# Patient Record
Sex: Male | Born: 1955
Health system: Southern US, Community
[De-identification: ages and names within clinical notes are randomized; demographics above are authoritative.]

## PROBLEM LIST (undated history)

## (undated) DIAGNOSIS — E079 Disorder of thyroid, unspecified: Secondary | ICD-10-CM

## (undated) DIAGNOSIS — M1990 Unspecified osteoarthritis, unspecified site: Secondary | ICD-10-CM

## (undated) DIAGNOSIS — K219 Gastro-esophageal reflux disease without esophagitis: Secondary | ICD-10-CM

## (undated) DIAGNOSIS — E039 Hypothyroidism, unspecified: Secondary | ICD-10-CM

## (undated) DIAGNOSIS — D509 Iron deficiency anemia, unspecified: Secondary | ICD-10-CM

## (undated) HISTORY — DX: Disorder of thyroid, unspecified: E07.9

## (undated) HISTORY — PX: TONSILLECTOMY: SUR1361

## (undated) HISTORY — PX: VASECTOMY: SHX75

---

## 2006-02-24 ENCOUNTER — Ambulatory Visit: Payer: Self-pay | Admitting: Internal Medicine

## 2006-09-06 ENCOUNTER — Ambulatory Visit: Payer: Self-pay | Admitting: Internal Medicine

## 2006-10-12 ENCOUNTER — Ambulatory Visit: Payer: Self-pay | Admitting: Gastroenterology

## 2009-06-11 IMAGING — CR DG SHOULDER 3+V*L*
1 series · 4 of 4 positions shown · non-contrast
Comparison: none

REASON FOR EXAM: lt shoulder pain
COMMENTS:

PROCEDURE:     DXR - DXR SHOULDER LEFT COMPLETE  - September 06, 2006 [DATE]
RESULT:     No fracture, dislocation or other significant osseous
abnormality is identified.

[Series 1: view not recorded · 0.17mm/px · 4 of 4 slices shown]
[im 1/4]
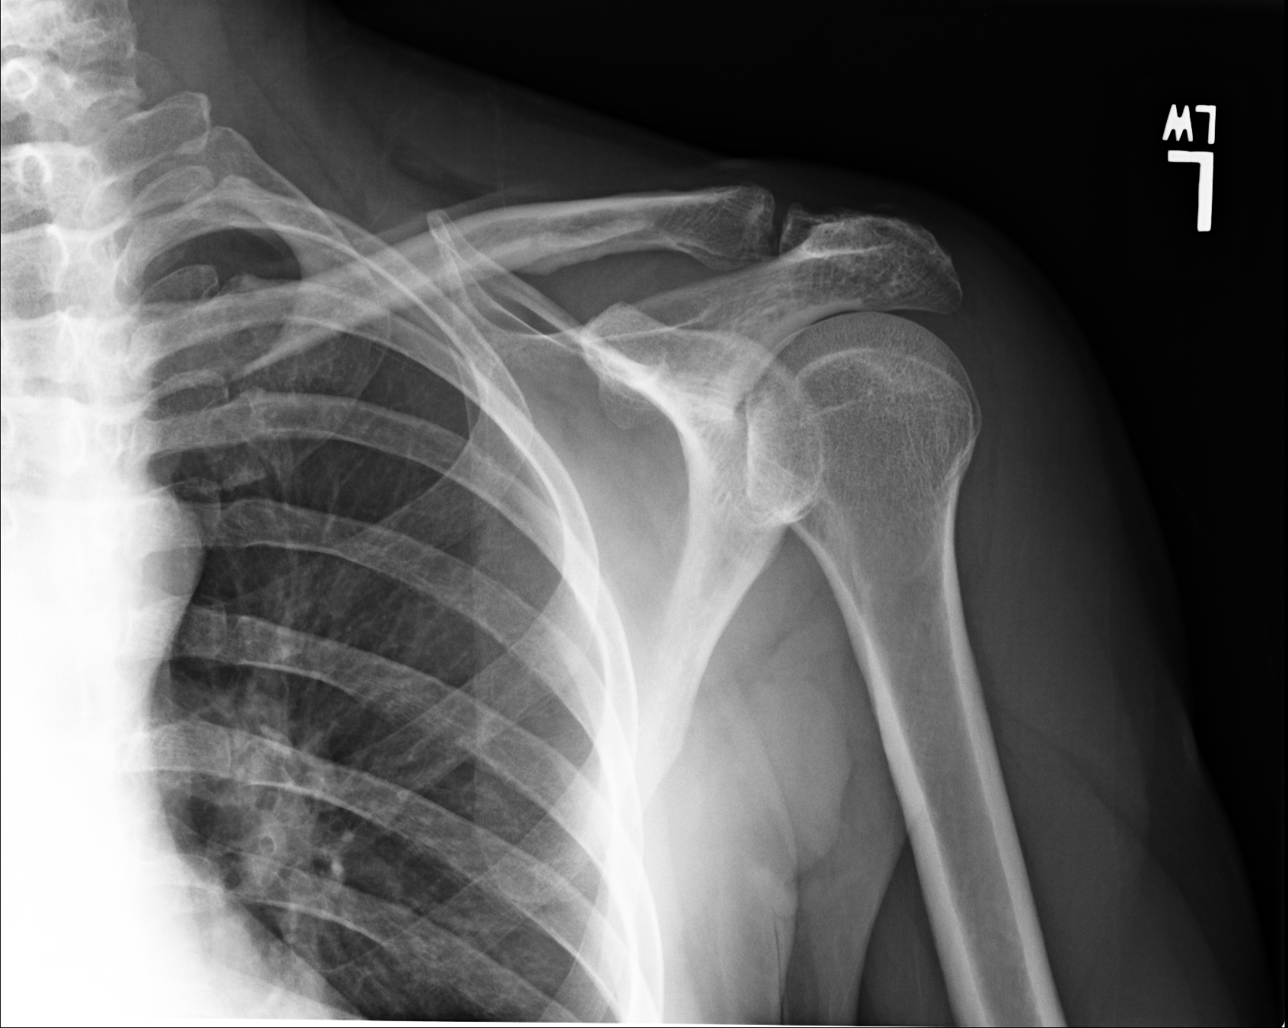
[im 2/4]
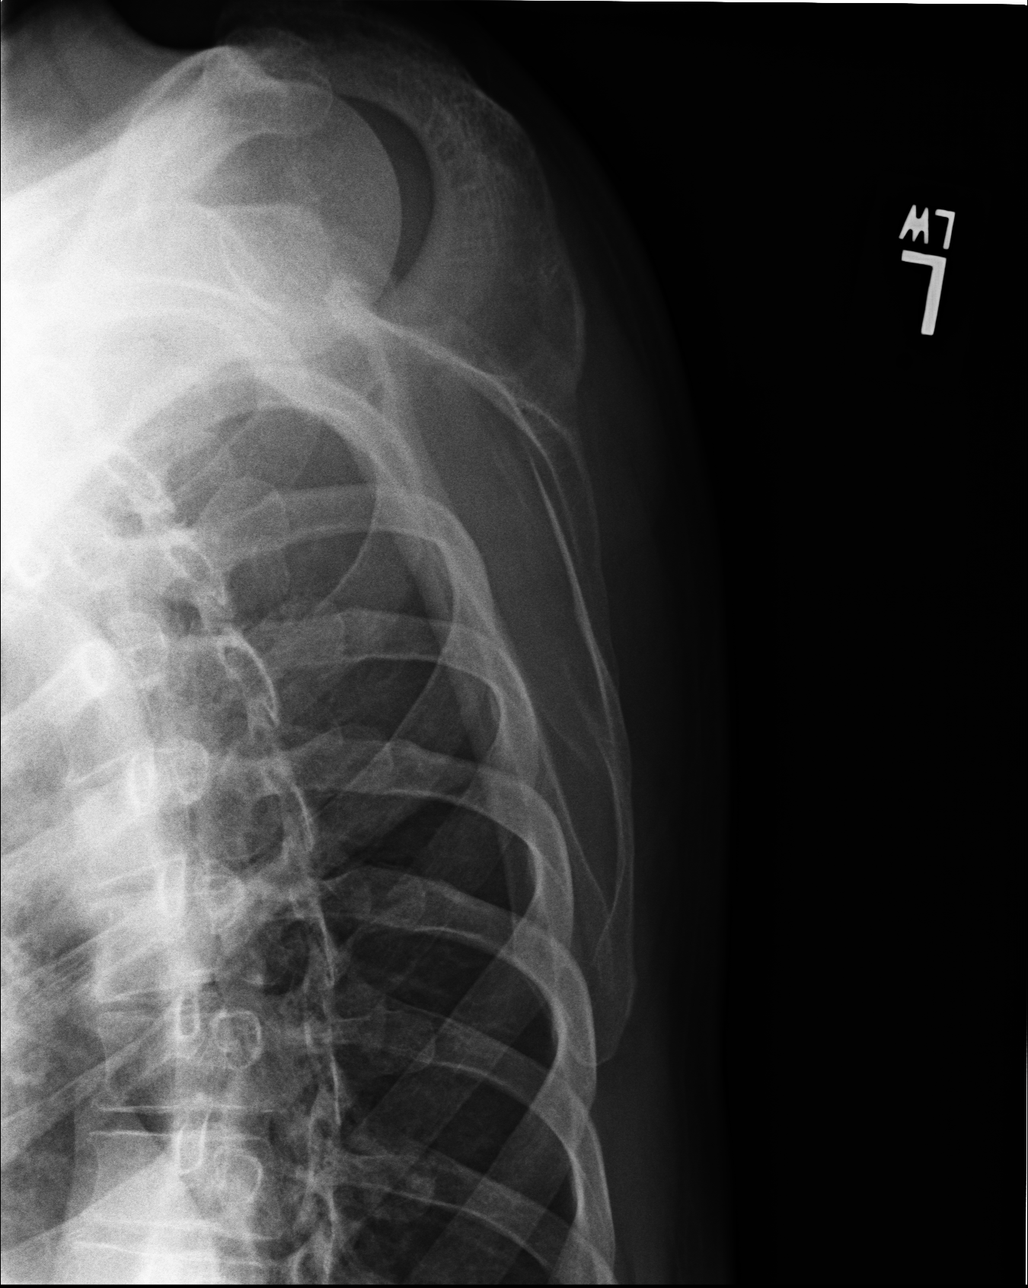
[im 3/4]
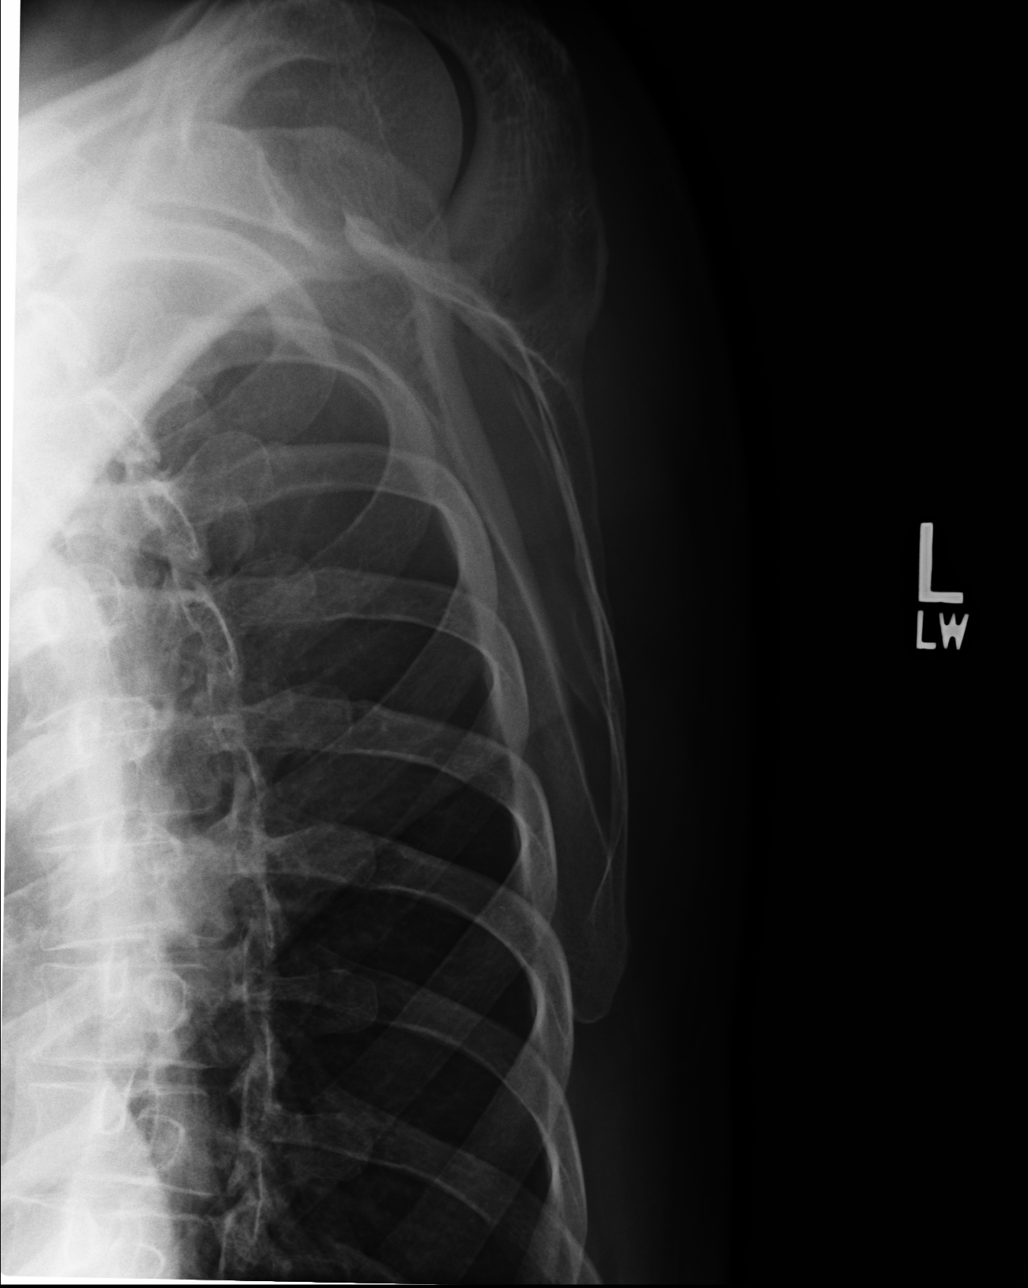
[im 4/4]
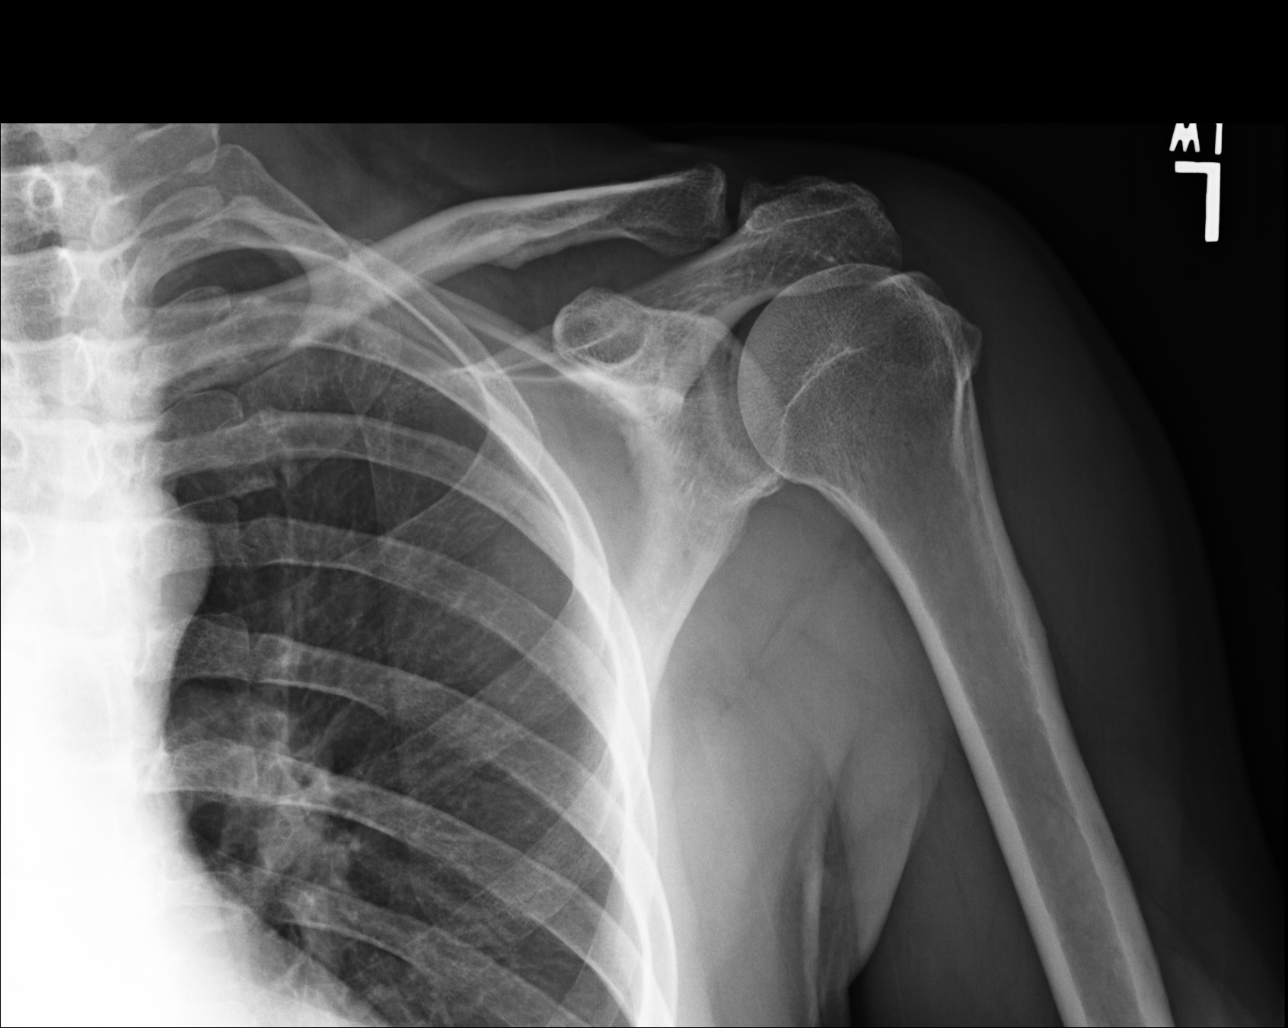

[4 of 4 positions shown; findings below may reference images not displayed]

IMPRESSION: 1.     No significant abnormalities are noted.

## 2011-01-07 ENCOUNTER — Other Ambulatory Visit: Payer: Self-pay | Admitting: Internal Medicine

## 2011-03-16 ENCOUNTER — Other Ambulatory Visit: Payer: Self-pay | Admitting: Internal Medicine

## 2011-05-20 ENCOUNTER — Other Ambulatory Visit: Payer: Self-pay | Admitting: Internal Medicine

## 2011-05-20 LAB — BASIC METABOLIC PANEL
Anion Gap: 9 (ref 7–16)
Creatinine: 0.87 mg/dL (ref 0.60–1.30)
EGFR (Non-African Amer.): >60
Glucose: 99 mg/dL (ref 65–99)
Osmolality: 283 (ref 275–301)
Potassium: 3.8 mmol/L (ref 3.5–5.1)
Sodium: 142 mmol/L (ref 136–145)

## 2011-05-20 LAB — LIPID PANEL
Ldl Cholesterol, Calc: 102 mg/dL — ABNORMAL HIGH (ref 0–100)
VLDL Cholesterol, Calc: 18 mg/dL (ref 5–40)

## 2011-05-20 LAB — HEMOGLOBIN A1C: Hemoglobin A1C: 5.6 % (ref 4.2–6.3)

## 2011-11-16 ENCOUNTER — Other Ambulatory Visit: Payer: Self-pay

## 2012-05-26 ENCOUNTER — Other Ambulatory Visit: Payer: Self-pay | Admitting: Internal Medicine

## 2012-05-26 LAB — URINALYSIS, COMPLETE
Bacteria: NONE SEEN
Bilirubin,UR: NEGATIVE
Blood: NEGATIVE
Glucose,UR: NEGATIVE mg/dL (ref 0–75)
Ketone: NEGATIVE
Nitrite: NEGATIVE
Ph: 6 (ref 4.5–8.0)
Protein: NEGATIVE
RBC,UR: <1 /HPF (ref 0–5)
Specific Gravity: 1.01 (ref 1.003–1.030)
Squamous Epithelial: NONE SEEN
WBC UR: <1 /HPF (ref 0–5)

## 2012-05-26 LAB — CBC WITH DIFFERENTIAL/PLATELET
Eosinophil %: 4.2 %
HCT: 38 % — ABNORMAL LOW (ref 40.0–52.0)
HGB: 12.7 g/dL — ABNORMAL LOW (ref 13.0–18.0)
Lymphocyte #: 2.4 10*3/uL (ref 1.0–3.6)
MCH: 28.1 pg (ref 26.0–34.0)
MCHC: 33.5 g/dL (ref 32.0–36.0)
MCV: 84 fL (ref 80–100)
Monocyte #: 0.7 x10 3/mm (ref 0.2–1.0)
Neutrophil %: 44.5 %
Platelet: 337 10*3/uL (ref 150–440)
RBC: 4.52 10*6/uL (ref 4.40–5.90)

## 2012-05-26 LAB — LIPID PANEL
Triglycerides: 118 mg/dL (ref 0–200)
VLDL Cholesterol, Calc: 24 mg/dL (ref 5–40)

## 2012-05-26 LAB — HEMOGLOBIN A1C: Hemoglobin A1C: 5.5 % (ref 4.2–6.3)

## 2012-05-26 LAB — BASIC METABOLIC PANEL
Anion Gap: 4 — ABNORMAL LOW (ref 7–16)
BUN: 10 mg/dL (ref 7–18)
Calcium, Total: 8.5 mg/dL (ref 8.5–10.1)
EGFR (African American): >60
EGFR (Non-African Amer.): >60
Osmolality: 274 (ref 275–301)
Potassium: 4.1 mmol/L (ref 3.5–5.1)
Sodium: 138 mmol/L (ref 136–145)

## 2012-05-26 LAB — HEPATIC FUNCTION PANEL A (ARMC)
Alkaline Phosphatase: 81 U/L (ref 50–136)
Bilirubin, Direct: <0.1 mg/dL (ref 0.00–0.20)
Total Protein: 7.3 g/dL (ref 6.4–8.2)

## 2012-05-26 LAB — TSH: Thyroid Stimulating Horm: 2.62 u[IU]/mL

## 2012-08-11 ENCOUNTER — Ambulatory Visit: Payer: Self-pay | Admitting: General Practice

## 2012-11-21 ENCOUNTER — Other Ambulatory Visit: Payer: Self-pay | Admitting: Internal Medicine

## 2012-11-21 LAB — URINALYSIS, COMPLETE
Bilirubin,UR: NEGATIVE
Ketone: NEGATIVE
RBC,UR: NONE SEEN /HPF (ref 0–5)
Squamous Epithelial: <1
WBC UR: 1 /HPF (ref 0–5)

## 2012-11-21 LAB — CBC WITH DIFFERENTIAL/PLATELET
Basophil %: 2.1 %
Lymphocyte %: 34.6 %
MCHC: 34.4 g/dL (ref 32.0–36.0)
Monocyte #: 0.5 x10 3/mm (ref 0.2–1.0)
Neutrophil #: 3.1 10*3/uL (ref 1.4–6.5)
Platelet: 373 10*3/uL (ref 150–440)
RBC: 4.55 10*6/uL (ref 4.40–5.90)
WBC: 6 10*3/uL (ref 3.8–10.6)

## 2012-11-21 LAB — BASIC METABOLIC PANEL
Anion Gap: 4 — ABNORMAL LOW (ref 7–16)
BUN: 8 mg/dL (ref 7–18)
Co2: 29 mmol/L (ref 21–32)
EGFR (African American): >60
Glucose: 96 mg/dL (ref 65–99)
Osmolality: 274 (ref 275–301)
Potassium: 3.7 mmol/L (ref 3.5–5.1)
Sodium: 138 mmol/L (ref 136–145)

## 2012-11-21 LAB — TSH: Thyroid Stimulating Horm: 3.4 u[IU]/mL

## 2012-11-21 LAB — IRON AND TIBC
Iron Bind.Cap.(Total): 487 ug/dL — ABNORMAL HIGH (ref 250–450)
Iron Saturation: 5 %
Iron: 24 ug/dL — ABNORMAL LOW (ref 65–175)
Unbound Iron-Bind.Cap.: 463 ug/dL

## 2012-11-21 LAB — RETICULOCYTES: Reticulocyte: 1.16 % (ref 0.4–3.1)

## 2012-11-21 LAB — LIPID PANEL: VLDL Cholesterol, Calc: 21 mg/dL (ref 5–40)

## 2012-11-30 ENCOUNTER — Other Ambulatory Visit: Payer: Self-pay | Admitting: Internal Medicine

## 2012-12-01 LAB — PSA: PSA: 1.1 ng/mL (ref 0.0–4.0)

## 2013-01-13 ENCOUNTER — Other Ambulatory Visit: Payer: Self-pay | Admitting: Family

## 2013-01-13 LAB — CBC WITH DIFFERENTIAL/PLATELET
Eosinophil #: 0.2 10*3/uL (ref 0.0–0.7)
HGB: 11.9 g/dL — ABNORMAL LOW (ref 13.0–18.0)
Lymphocyte %: 35.4 %
MCHC: 33.4 g/dL (ref 32.0–36.0)
MCV: 81 fL (ref 80–100)
Neutrophil #: 3.1 10*3/uL (ref 1.4–6.5)
Neutrophil %: 51 %
WBC: 6.1 10*3/uL (ref 3.8–10.6)

## 2013-01-13 LAB — IRON AND TIBC
Iron Bind.Cap.(Total): 473 ug/dL — ABNORMAL HIGH (ref 250–450)
Iron: 49 ug/dL — ABNORMAL LOW (ref 65–175)
Unbound Iron-Bind.Cap.: 424 ug/dL

## 2013-02-17 ENCOUNTER — Ambulatory Visit: Payer: Self-pay | Admitting: Gastroenterology

## 2013-03-03 ENCOUNTER — Other Ambulatory Visit: Payer: Self-pay | Admitting: Family

## 2013-03-03 LAB — CBC WITH DIFFERENTIAL/PLATELET
BASOS PCT: 1 %
Basophil #: 0.1 10*3/uL (ref 0.0–0.1)
Eosinophil #: 0.2 10*3/uL (ref 0.0–0.7)
Eosinophil %: 3.1 %
HCT: 41.7 % (ref 40.0–52.0)
HGB: 14 g/dL (ref 13.0–18.0)
LYMPHS PCT: 32.5 %
Lymphocyte #: 1.9 10*3/uL (ref 1.0–3.6)
MCH: 28.2 pg (ref 26.0–34.0)
MCHC: 33.4 g/dL (ref 32.0–36.0)
MCV: 84 fL (ref 80–100)
MONOS PCT: 9.6 %
Monocyte #: 0.6 x10 3/mm (ref 0.2–1.0)
NEUTROS ABS: 3.1 10*3/uL (ref 1.4–6.5)
NEUTROS PCT: 53.8 %
Platelet: 329 10*3/uL (ref 150–440)
RBC: 4.94 10*6/uL (ref 4.40–5.90)
RDW: 14.9 % — AB (ref 11.5–14.5)
WBC: 5.8 10*3/uL (ref 3.8–10.6)

## 2013-03-03 LAB — IRON: Iron: 69 ug/dL (ref 65–175)

## 2013-05-30 ENCOUNTER — Other Ambulatory Visit: Payer: Self-pay | Admitting: Internal Medicine

## 2013-05-30 LAB — BASIC METABOLIC PANEL
ANION GAP: 5 — AB (ref 7–16)
BUN: 9 mg/dL (ref 7–18)
CO2: 31 mmol/L (ref 21–32)
Calcium, Total: 8.7 mg/dL (ref 8.5–10.1)
Chloride: 104 mmol/L (ref 98–107)
Creatinine: 0.94 mg/dL (ref 0.60–1.30)
Glucose: 91 mg/dL (ref 65–99)
Osmolality: 278 (ref 275–301)
Potassium: 3.9 mmol/L (ref 3.5–5.1)
Sodium: 140 mmol/L (ref 136–145)

## 2013-05-30 LAB — CBC WITH DIFFERENTIAL/PLATELET
BASOS ABS: 0.1 10*3/uL (ref 0.0–0.1)
BASOS PCT: 1.3 %
Eosinophil #: 0.3 10*3/uL (ref 0.0–0.7)
Eosinophil %: 5.1 %
HCT: 41.9 % (ref 40.0–52.0)
HGB: 14.1 g/dL (ref 13.0–18.0)
LYMPHS ABS: 2.3 10*3/uL (ref 1.0–3.6)
LYMPHS PCT: 38.3 %
MCH: 28.6 pg (ref 26.0–34.0)
MCHC: 33.5 g/dL (ref 32.0–36.0)
MCV: 85 fL (ref 80–100)
MONO ABS: 0.6 x10 3/mm (ref 0.2–1.0)
MONOS PCT: 10.3 %
NEUTROS ABS: 2.7 10*3/uL (ref 1.4–6.5)
Neutrophil %: 45 %
Platelet: 276 10*3/uL (ref 150–440)
RBC: 4.93 10*6/uL (ref 4.40–5.90)
RDW: 13.4 % (ref 11.5–14.5)
WBC: 6.1 10*3/uL (ref 3.8–10.6)

## 2013-05-30 LAB — URINALYSIS, COMPLETE
BACTERIA: NONE SEEN
BILIRUBIN, UR: NEGATIVE
Blood: NEGATIVE
Glucose,UR: NEGATIVE mg/dL (ref 0–75)
Ketone: NEGATIVE
Leukocyte Esterase: NEGATIVE
NITRITE: NEGATIVE
PH: 6 (ref 4.5–8.0)
PROTEIN: NEGATIVE
SPECIFIC GRAVITY: 1.009 (ref 1.003–1.030)
SQUAMOUS EPITHELIAL: NONE SEEN
WBC UR: <1 /HPF (ref 0–5)

## 2013-05-30 LAB — HEPATIC FUNCTION PANEL A (ARMC)
ALT: 23 U/L (ref 12–78)
AST: 16 U/L (ref 15–37)
Albumin: 3.7 g/dL (ref 3.4–5.0)
Alkaline Phosphatase: 81 U/L
BILIRUBIN DIRECT: 0.1 mg/dL (ref 0.00–0.20)
BILIRUBIN TOTAL: 0.6 mg/dL (ref 0.2–1.0)
Total Protein: 7.6 g/dL (ref 6.4–8.2)

## 2013-05-30 LAB — LIPID PANEL
CHOLESTEROL: 204 mg/dL — AB (ref 0–200)
HDL: 39 mg/dL — AB (ref 40–60)
Ldl Cholesterol, Calc: 133 mg/dL — ABNORMAL HIGH (ref 0–100)
TRIGLYCERIDES: 162 mg/dL (ref 0–200)
VLDL Cholesterol, Calc: 32 mg/dL (ref 5–40)

## 2013-05-30 LAB — TSH: Thyroid Stimulating Horm: 2.8 u[IU]/mL

## 2013-12-01 ENCOUNTER — Ambulatory Visit: Payer: Self-pay | Admitting: Internal Medicine

## 2013-12-01 LAB — BASIC METABOLIC PANEL
Anion Gap: 7 (ref 7–16)
BUN: 15 mg/dL (ref 7–18)
CALCIUM: 8.4 mg/dL — AB (ref 8.5–10.1)
CHLORIDE: 107 mmol/L (ref 98–107)
CO2: 28 mmol/L (ref 21–32)
Creatinine: 0.97 mg/dL (ref 0.60–1.30)
EGFR (African American): >60
EGFR (Non-African Amer.): >60
Glucose: 89 mg/dL (ref 65–99)
Osmolality: 283 (ref 275–301)
POTASSIUM: 4 mmol/L (ref 3.5–5.1)
Sodium: 142 mmol/L (ref 136–145)

## 2013-12-01 LAB — URINALYSIS, COMPLETE
BACTERIA: NONE SEEN
BILIRUBIN, UR: NEGATIVE
Blood: NEGATIVE
GLUCOSE, UR: NEGATIVE mg/dL (ref 0–75)
Ketone: NEGATIVE
LEUKOCYTE ESTERASE: NEGATIVE
Nitrite: NEGATIVE
PH: 6 (ref 4.5–8.0)
PROTEIN: NEGATIVE
RBC,UR: <1 /HPF (ref 0–5)
Specific Gravity: 1.02 (ref 1.003–1.030)
Squamous Epithelial: <1
WBC UR: <1 /HPF (ref 0–5)

## 2013-12-01 LAB — CBC WITH DIFFERENTIAL/PLATELET
Basophil #: 0.2 10*3/uL — ABNORMAL HIGH (ref 0.0–0.1)
Basophil %: 2.2 %
EOS ABS: 0.3 10*3/uL (ref 0.0–0.7)
Eosinophil %: 4.3 %
HCT: 42.1 % (ref 40.0–52.0)
HGB: 14 g/dL (ref 13.0–18.0)
LYMPHS ABS: 2.7 10*3/uL (ref 1.0–3.6)
Lymphocyte %: 40.9 %
MCH: 29.2 pg (ref 26.0–34.0)
MCHC: 33.1 g/dL (ref 32.0–36.0)
MCV: 88 fL (ref 80–100)
MONOS PCT: 10.3 %
Monocyte #: 0.7 x10 3/mm (ref 0.2–1.0)
NEUTROS ABS: 2.8 10*3/uL (ref 1.4–6.5)
Neutrophil %: 42.3 %
Platelet: 325 10*3/uL (ref 150–440)
RBC: 4.78 10*6/uL (ref 4.40–5.90)
RDW: 13.2 % (ref 11.5–14.5)
WBC: 6.7 10*3/uL (ref 3.8–10.6)

## 2013-12-01 LAB — LIPID PANEL
Cholesterol: 202 mg/dL — ABNORMAL HIGH (ref 0–200)
HDL: 40 mg/dL (ref 40–60)
Ldl Cholesterol, Calc: 136 mg/dL — ABNORMAL HIGH (ref 0–100)
Triglycerides: 130 mg/dL (ref 0–200)
VLDL Cholesterol, Calc: 26 mg/dL (ref 5–40)

## 2013-12-01 LAB — IRON: IRON: 124 ug/dL (ref 65–175)

## 2013-12-01 LAB — FERRITIN: Ferritin (ARMC): 30 ng/mL (ref 8–388)

## 2013-12-04 LAB — PSA: PSA: 1 ng/mL (ref 0.0–4.0)

## 2014-06-08 ENCOUNTER — Other Ambulatory Visit
Admission: RE | Admit: 2014-06-08 | Discharge: 2014-06-08 | Disposition: A | Discharge status: Home / Self Care | Payer: 59 | Source: Ambulatory Visit | Attending: Internal Medicine | Admitting: Internal Medicine

## 2014-06-08 DIAGNOSIS — E039 Hypothyroidism, unspecified: Secondary | ICD-10-CM | POA: Insufficient documentation

## 2014-06-08 LAB — COMPREHENSIVE METABOLIC PANEL
ALT: 16 U/L — ABNORMAL LOW (ref 17–63)
AST: 19 U/L (ref 15–41)
Albumin: 3.9 g/dL (ref 3.5–5.0)
Alkaline Phosphatase: 65 U/L (ref 38–126)
Anion gap: 4 — ABNORMAL LOW (ref 5–15)
BUN: 14 mg/dL (ref 6–20)
CO2: 29 mmol/L (ref 22–32)
CREATININE: 0.92 mg/dL (ref 0.61–1.24)
Calcium: 8.7 mg/dL — ABNORMAL LOW (ref 8.9–10.3)
Chloride: 106 mmol/L (ref 101–111)
GFR calc Af Amer: >60 mL/min (ref >60)
Glucose, Bld: 105 mg/dL — ABNORMAL HIGH (ref 65–99)
POTASSIUM: 3.9 mmol/L (ref 3.5–5.1)
Sodium: 139 mmol/L (ref 135–145)
TOTAL PROTEIN: 7.5 g/dL (ref 6.5–8.1)
Total Bilirubin: 0.5 mg/dL (ref 0.3–1.2)

## 2014-06-08 LAB — URINALYSIS COMPLETE WITH MICROSCOPIC (ARMC ONLY)
Bacteria, UA: NONE SEEN
Bilirubin Urine: NEGATIVE
Glucose, UA: NEGATIVE mg/dL
Hgb urine dipstick: NEGATIVE
KETONES UR: NEGATIVE mg/dL
LEUKOCYTES UA: NEGATIVE
Nitrite: NEGATIVE
PH: 6 (ref 5.0–8.0)
Protein, ur: NEGATIVE mg/dL
RBC / HPF: NONE SEEN RBC/hpf (ref 0–5)
SQUAMOUS EPITHELIAL / LPF: NONE SEEN
Specific Gravity, Urine: 1.015 (ref 1.005–1.030)

## 2014-06-08 LAB — CBC WITH DIFFERENTIAL/PLATELET
BASOS ABS: 0.1 10*3/uL (ref 0–0.1)
Basophils Relative: 2 %
EOS ABS: 0.4 10*3/uL (ref 0–0.7)
Eosinophils Relative: 6 %
HCT: 42.8 % (ref 40.0–52.0)
Hemoglobin: 14.3 g/dL (ref 13.0–18.0)
LYMPHS ABS: 2.3 10*3/uL (ref 1.0–3.6)
Lymphocytes Relative: 41 %
MCH: 28.8 pg (ref 26.0–34.0)
MCHC: 33.5 g/dL (ref 32.0–36.0)
MCV: 85.9 fL (ref 80.0–100.0)
Monocytes Absolute: 0.5 10*3/uL (ref 0.2–1.0)
Monocytes Relative: 10 %
NEUTROS PCT: 41 %
Neutro Abs: 2.3 10*3/uL (ref 1.4–6.5)
PLATELETS: 291 10*3/uL (ref 150–440)
RBC: 4.98 MIL/uL (ref 4.40–5.90)
RDW: 12.8 % (ref 11.5–14.5)
WBC: 5.7 10*3/uL (ref 3.8–10.6)

## 2014-06-08 LAB — PSA: PSA: 0.93 ng/mL (ref 0.00–4.00)

## 2014-06-09 LAB — MISC LABCORP TEST (SEND OUT): LABCORP TEST CODE: 140659

## 2014-12-07 ENCOUNTER — Other Ambulatory Visit
Admission: RE | Admit: 2014-12-07 | Discharge: 2014-12-07 | Disposition: A | Discharge status: Home / Self Care | Payer: 59 | Source: Ambulatory Visit | Attending: Internal Medicine | Admitting: Internal Medicine

## 2014-12-07 DIAGNOSIS — E784 Other hyperlipidemia: Secondary | ICD-10-CM | POA: Insufficient documentation

## 2014-12-07 DIAGNOSIS — E034 Atrophy of thyroid (acquired): Secondary | ICD-10-CM | POA: Insufficient documentation

## 2014-12-07 DIAGNOSIS — E038 Other specified hypothyroidism: Secondary | ICD-10-CM | POA: Diagnosis present

## 2014-12-07 LAB — CBC WITH DIFFERENTIAL/PLATELET
BASOS PCT: 2 %
Basophils Absolute: 0.1 10*3/uL (ref 0–0.1)
Eosinophils Absolute: 0.3 10*3/uL (ref 0–0.7)
Eosinophils Relative: 4 %
HCT: 42.2 % (ref 40.0–52.0)
HEMOGLOBIN: 14.2 g/dL (ref 13.0–18.0)
LYMPHS ABS: 1.9 10*3/uL (ref 1.0–3.6)
Lymphocytes Relative: 31 %
MCH: 28.8 pg (ref 26.0–34.0)
MCHC: 33.7 g/dL (ref 32.0–36.0)
MCV: 85.7 fL (ref 80.0–100.0)
MONOS PCT: 11 %
Monocytes Absolute: 0.7 10*3/uL (ref 0.2–1.0)
NEUTROS PCT: 52 %
Neutro Abs: 3.1 10*3/uL (ref 1.4–6.5)
Platelets: 304 10*3/uL (ref 150–440)
RBC: 4.93 MIL/uL (ref 4.40–5.90)
RDW: 13.1 % (ref 11.5–14.5)
WBC: 6.1 10*3/uL (ref 3.8–10.6)

## 2014-12-07 LAB — LIPID PANEL
CHOLESTEROL: 209 mg/dL — AB (ref 0–200)
HDL: 42 mg/dL (ref >40)
LDL Cholesterol: 151 mg/dL — ABNORMAL HIGH (ref 0–99)
Total CHOL/HDL Ratio: 5 RATIO
Triglycerides: 81 mg/dL (ref <150)
VLDL: 16 mg/dL (ref 0–40)

## 2014-12-07 LAB — URINALYSIS COMPLETE WITH MICROSCOPIC (ARMC ONLY)
Bacteria, UA: NONE SEEN
Bilirubin Urine: NEGATIVE
Glucose, UA: NEGATIVE mg/dL
Ketones, ur: NEGATIVE mg/dL
Leukocytes, UA: NEGATIVE
Nitrite: NEGATIVE
PH: 5 (ref 5.0–8.0)
PROTEIN: NEGATIVE mg/dL
Specific Gravity, Urine: 1.015 (ref 1.005–1.030)

## 2014-12-07 LAB — COMPREHENSIVE METABOLIC PANEL
ALBUMIN: 4.3 g/dL (ref 3.5–5.0)
ALT: 19 U/L (ref 17–63)
AST: 22 U/L (ref 15–41)
Alkaline Phosphatase: 77 U/L (ref 38–126)
Anion gap: 7 (ref 5–15)
BILIRUBIN TOTAL: 0.3 mg/dL (ref 0.3–1.2)
BUN: 13 mg/dL (ref 6–20)
CHLORIDE: 105 mmol/L (ref 101–111)
CO2: 29 mmol/L (ref 22–32)
CREATININE: 0.98 mg/dL (ref 0.61–1.24)
Calcium: 9.4 mg/dL (ref 8.9–10.3)
GFR calc Af Amer: >60 mL/min (ref >60)
GFR calc non Af Amer: >60 mL/min (ref >60)
GLUCOSE: 103 mg/dL — AB (ref 65–99)
Potassium: 4.2 mmol/L (ref 3.5–5.1)
Sodium: 141 mmol/L (ref 135–145)
Total Protein: 7.6 g/dL (ref 6.5–8.1)

## 2014-12-07 LAB — TSH: TSH: 2.194 u[IU]/mL (ref 0.350–4.500)

## 2015-02-26 ENCOUNTER — Ambulatory Visit: Payer: Self-pay | Admitting: Physician Assistant

## 2015-02-26 ENCOUNTER — Encounter: Payer: Self-pay | Admitting: Physician Assistant

## 2015-02-26 VITALS — BP 110/70 | HR 88 | Temp 100.3°F

## 2015-02-26 DIAGNOSIS — R509 Fever, unspecified: Secondary | ICD-10-CM

## 2015-02-26 DIAGNOSIS — J069 Acute upper respiratory infection, unspecified: Secondary | ICD-10-CM

## 2015-02-26 LAB — POCT INFLUENZA A/B
Influenza A, POC: NEGATIVE
Influenza B, POC: NEGATIVE

## 2015-02-26 MED ORDER — HYDROCOD POLST-CPM POLST ER 10-8 MG/5ML PO SUER: 5.0000 mL | Freq: Two times a day (BID) | ORAL | Status: DC | PRN

## 2015-02-26 MED ORDER — LEVOFLOXACIN 500 MG PO TABS: 500.0000 mg | ORAL_TABLET | Freq: Every day | ORAL | Status: DC

## 2015-02-26 NOTE — Progress Notes (Signed)
S: c/o fever, chills, cough and congestion with yellow mucus, some body aches, no cp/sob, no v/d, sx started yesterday  O: vitals wnl, nad, tms dull , nasal mucosa wnl, throat injected, neck supple no lymph, lungs c t a, cv rrr, flu swab neg  A: acute uri  P: levaquin 500mg  qd x 10d, tussionex, otc meds for fever, no work until has not had fever in 24 hours

## 2015-03-28 DIAGNOSIS — M955 Acquired deformity of pelvis: Secondary | ICD-10-CM | POA: Diagnosis not present

## 2015-03-28 DIAGNOSIS — M9905 Segmental and somatic dysfunction of pelvic region: Secondary | ICD-10-CM | POA: Diagnosis not present

## 2015-03-28 DIAGNOSIS — M5416 Radiculopathy, lumbar region: Secondary | ICD-10-CM | POA: Diagnosis not present

## 2015-03-28 DIAGNOSIS — M9903 Segmental and somatic dysfunction of lumbar region: Secondary | ICD-10-CM | POA: Diagnosis not present

## 2015-06-07 ENCOUNTER — Other Ambulatory Visit
Admission: RE | Admit: 2015-06-07 | Discharge: 2015-06-07 | Disposition: A | Discharge status: Home / Self Care | Payer: 59 | Source: Ambulatory Visit | Attending: Internal Medicine | Admitting: Internal Medicine

## 2015-06-07 DIAGNOSIS — R972 Elevated prostate specific antigen [PSA]: Secondary | ICD-10-CM | POA: Diagnosis not present

## 2015-06-07 DIAGNOSIS — R739 Hyperglycemia, unspecified: Secondary | ICD-10-CM | POA: Diagnosis not present

## 2015-06-07 DIAGNOSIS — D509 Iron deficiency anemia, unspecified: Secondary | ICD-10-CM | POA: Diagnosis not present

## 2015-06-07 DIAGNOSIS — E034 Atrophy of thyroid (acquired): Secondary | ICD-10-CM | POA: Insufficient documentation

## 2015-06-07 DIAGNOSIS — Z125 Encounter for screening for malignant neoplasm of prostate: Secondary | ICD-10-CM | POA: Diagnosis not present

## 2015-06-07 LAB — CBC WITH DIFFERENTIAL/PLATELET
BASOS ABS: 0.1 10*3/uL (ref 0–0.1)
Basophils Relative: 2 %
EOS PCT: 4 %
Eosinophils Absolute: 0.2 10*3/uL (ref 0–0.7)
HCT: 41.1 % (ref 40.0–52.0)
Hemoglobin: 14 g/dL (ref 13.0–18.0)
LYMPHS PCT: 40 %
Lymphs Abs: 2.1 10*3/uL (ref 1.0–3.6)
MCH: 28.8 pg (ref 26.0–34.0)
MCHC: 34 g/dL (ref 32.0–36.0)
MCV: 84.5 fL (ref 80.0–100.0)
Monocytes Absolute: 0.5 10*3/uL (ref 0.2–1.0)
Monocytes Relative: 10 %
Neutro Abs: 2.3 10*3/uL (ref 1.4–6.5)
Neutrophils Relative %: 44 %
PLATELETS: 328 10*3/uL (ref 150–440)
RBC: 4.87 MIL/uL (ref 4.40–5.90)
RDW: 13.2 % (ref 11.5–14.5)
WBC: 5.3 10*3/uL (ref 3.8–10.6)

## 2015-06-07 LAB — COMPREHENSIVE METABOLIC PANEL
ALT: 17 U/L (ref 17–63)
AST: 19 U/L (ref 15–41)
Albumin: 4.2 g/dL (ref 3.5–5.0)
Alkaline Phosphatase: 63 U/L (ref 38–126)
Anion gap: 8 (ref 5–15)
BUN: 11 mg/dL (ref 6–20)
CHLORIDE: 108 mmol/L (ref 101–111)
CO2: 26 mmol/L (ref 22–32)
Calcium: 9 mg/dL (ref 8.9–10.3)
Creatinine, Ser: 1.04 mg/dL (ref 0.61–1.24)
Glucose, Bld: 101 mg/dL — ABNORMAL HIGH (ref 65–99)
POTASSIUM: 3.6 mmol/L (ref 3.5–5.1)
Sodium: 142 mmol/L (ref 135–145)
Total Bilirubin: 0.6 mg/dL (ref 0.3–1.2)
Total Protein: 7.2 g/dL (ref 6.5–8.1)

## 2015-06-07 LAB — URINALYSIS COMPLETE WITH MICROSCOPIC (ARMC ONLY)
BILIRUBIN URINE: NEGATIVE
Bacteria, UA: NONE SEEN
GLUCOSE, UA: NEGATIVE mg/dL
HGB URINE DIPSTICK: NEGATIVE
KETONES UR: NEGATIVE mg/dL
LEUKOCYTES UA: NEGATIVE
NITRITE: NEGATIVE
Protein, ur: NEGATIVE mg/dL
SPECIFIC GRAVITY, URINE: 1.015 (ref 1.005–1.030)
Squamous Epithelial / LPF: NONE SEEN
WBC, UA: NONE SEEN WBC/hpf (ref 0–5)
pH: 6 (ref 5.0–8.0)

## 2015-06-07 LAB — HEMOGLOBIN A1C: HEMOGLOBIN A1C: 5.5 % (ref 4.0–6.0)

## 2015-06-07 LAB — LIPID PANEL
CHOL/HDL RATIO: 5.3 ratio
CHOLESTEROL: 216 mg/dL — AB (ref 0–200)
HDL: 41 mg/dL (ref >40)
LDL Cholesterol: 155 mg/dL — ABNORMAL HIGH (ref 0–99)
Triglycerides: 101 mg/dL (ref <150)
VLDL: 20 mg/dL (ref 0–40)

## 2015-06-07 LAB — PSA: PSA: 0.94 ng/mL (ref 0.00–4.00)

## 2015-06-07 LAB — TSH: TSH: 2.691 u[IU]/mL (ref 0.350–4.500)

## 2015-06-12 DIAGNOSIS — E034 Atrophy of thyroid (acquired): Secondary | ICD-10-CM | POA: Diagnosis not present

## 2015-06-12 DIAGNOSIS — K219 Gastro-esophageal reflux disease without esophagitis: Secondary | ICD-10-CM | POA: Diagnosis not present

## 2015-06-12 DIAGNOSIS — E784 Other hyperlipidemia: Secondary | ICD-10-CM | POA: Diagnosis not present

## 2015-06-12 DIAGNOSIS — Z0001 Encounter for general adult medical examination with abnormal findings: Secondary | ICD-10-CM | POA: Diagnosis not present

## 2015-07-08 ENCOUNTER — Ambulatory Visit: Payer: Self-pay | Admitting: Physician Assistant

## 2015-07-08 ENCOUNTER — Encounter: Payer: Self-pay | Admitting: Physician Assistant

## 2015-07-08 VITALS — BP 118/80 | HR 64 | Temp 99.2°F

## 2015-07-08 DIAGNOSIS — R509 Fever, unspecified: Secondary | ICD-10-CM

## 2015-07-08 DIAGNOSIS — J069 Acute upper respiratory infection, unspecified: Secondary | ICD-10-CM

## 2015-07-08 MED ORDER — LEVOFLOXACIN 500 MG PO TABS: 500.0000 mg | ORAL_TABLET | Freq: Every day | ORAL | Status: DC

## 2015-07-08 NOTE — Progress Notes (Signed)
S: C/o runny nose, sore throat, cough and congestion for 3 days, + fever, chills, temp around 101 earlier; denies  cp/sob, v/d; mucus was green this am    Using otc meds: robitussin  O: PE: vitals wnl, nad, perrl eomi, normocephalic, tms dull, nasal mucosa red and swollen, throat red, neck supple no lymph, lungs c t a, cv rrr, neuro intact  A:  Acute uri   P: drink fluids, continue regular meds , use otc meds of choice, return if not improving in 5 days, return earlier if worsening, levaquin 500mg  qd x 10d

## 2015-11-04 ENCOUNTER — Encounter: Payer: Self-pay | Admitting: Physician Assistant

## 2015-11-04 ENCOUNTER — Ambulatory Visit: Payer: Self-pay | Admitting: Physician Assistant

## 2015-11-04 VITALS — BP 108/80 | HR 80 | Temp 98.1°F

## 2015-11-04 DIAGNOSIS — L259 Unspecified contact dermatitis, unspecified cause: Secondary | ICD-10-CM

## 2015-11-04 MED ORDER — DEXAMETHASONE SODIUM PHOSPHATE 10 MG/ML IJ SOLN
10.0000 mg | Freq: Once | INTRAMUSCULAR | Status: AC
  Administered 2015-11-04: 10 mg via INTRAMUSCULAR

## 2015-11-04 NOTE — Progress Notes (Signed)
S: c/o itchy rash on  arms for about a week; noticed after he used a towel at the gym, said the towel felt really stiff, did also go outside while in [place];  tried multiple otc meds without relief, denies fever/chills  O: vitals wnl, nad, lungs c t a, cv rrr, skin with small raised red areas , no drainage, n/v intact  A: acute contact dermatitis  P: decadron 10mg  im

## 2015-11-12 DIAGNOSIS — M9903 Segmental and somatic dysfunction of lumbar region: Secondary | ICD-10-CM | POA: Diagnosis not present

## 2015-11-12 DIAGNOSIS — M5416 Radiculopathy, lumbar region: Secondary | ICD-10-CM | POA: Diagnosis not present

## 2015-11-12 DIAGNOSIS — M955 Acquired deformity of pelvis: Secondary | ICD-10-CM | POA: Diagnosis not present

## 2015-11-12 DIAGNOSIS — M9905 Segmental and somatic dysfunction of pelvic region: Secondary | ICD-10-CM | POA: Diagnosis not present

## 2015-12-23 DIAGNOSIS — E784 Other hyperlipidemia: Secondary | ICD-10-CM | POA: Diagnosis not present

## 2015-12-23 DIAGNOSIS — D509 Iron deficiency anemia, unspecified: Secondary | ICD-10-CM | POA: Diagnosis not present

## 2015-12-23 DIAGNOSIS — K219 Gastro-esophageal reflux disease without esophagitis: Secondary | ICD-10-CM | POA: Diagnosis not present

## 2015-12-23 DIAGNOSIS — E034 Atrophy of thyroid (acquired): Secondary | ICD-10-CM | POA: Diagnosis not present

## 2015-12-24 ENCOUNTER — Other Ambulatory Visit
Admission: RE | Admit: 2015-12-24 | Discharge: 2015-12-24 | Disposition: A | Discharge status: Home / Self Care | Payer: 59 | Source: Ambulatory Visit | Attending: Internal Medicine | Admitting: Internal Medicine

## 2015-12-24 DIAGNOSIS — D509 Iron deficiency anemia, unspecified: Secondary | ICD-10-CM | POA: Insufficient documentation

## 2015-12-24 LAB — COMPREHENSIVE METABOLIC PANEL
ALK PHOS: 62 U/L (ref 38–126)
ALT: 16 U/L — AB (ref 17–63)
ANION GAP: 4 — AB (ref 5–15)
AST: 19 U/L (ref 15–41)
Albumin: 4.2 g/dL (ref 3.5–5.0)
BILIRUBIN TOTAL: 0.7 mg/dL (ref 0.3–1.2)
BUN: 13 mg/dL (ref 6–20)
CALCIUM: 9 mg/dL (ref 8.9–10.3)
CO2: 29 mmol/L (ref 22–32)
CREATININE: 0.88 mg/dL (ref 0.61–1.24)
Chloride: 105 mmol/L (ref 101–111)
Glucose, Bld: 101 mg/dL — ABNORMAL HIGH (ref 65–99)
Potassium: 4.3 mmol/L (ref 3.5–5.1)
Sodium: 138 mmol/L (ref 135–145)
TOTAL PROTEIN: 7.3 g/dL (ref 6.5–8.1)

## 2015-12-24 LAB — CBC WITH DIFFERENTIAL/PLATELET
Basophils Absolute: 0.1 10*3/uL (ref 0–0.1)
Basophils Relative: 2 %
Eosinophils Absolute: 0.2 10*3/uL (ref 0–0.7)
Eosinophils Relative: 4 %
HCT: 41.8 % (ref 40.0–52.0)
HEMOGLOBIN: 14.5 g/dL (ref 13.0–18.0)
LYMPHS ABS: 2.4 10*3/uL (ref 1.0–3.6)
LYMPHS PCT: 41 %
MCH: 29.9 pg (ref 26.0–34.0)
MCHC: 34.6 g/dL (ref 32.0–36.0)
MCV: 86.6 fL (ref 80.0–100.0)
Monocytes Absolute: 0.6 10*3/uL (ref 0.2–1.0)
Monocytes Relative: 10 %
NEUTROS PCT: 43 %
Neutro Abs: 2.6 10*3/uL (ref 1.4–6.5)
Platelets: 310 10*3/uL (ref 150–440)
RBC: 4.83 MIL/uL (ref 4.40–5.90)
RDW: 13 % (ref 11.5–14.5)
WBC: 5.8 10*3/uL (ref 3.8–10.6)

## 2015-12-24 LAB — LIPID PANEL
CHOLESTEROL: 219 mg/dL — AB (ref 0–200)
HDL: 45 mg/dL (ref >40)
LDL Cholesterol: 153 mg/dL — ABNORMAL HIGH (ref 0–99)
TRIGLYCERIDES: 105 mg/dL (ref <150)
Total CHOL/HDL Ratio: 4.9 RATIO
VLDL: 21 mg/dL (ref 0–40)

## 2015-12-24 LAB — TSH: TSH: 2.512 u[IU]/mL (ref 0.350–4.500)

## 2015-12-25 LAB — TESTOSTERONE: Testosterone: 505 ng/dL (ref 264–916)

## 2016-01-31 ENCOUNTER — Encounter: Payer: Self-pay | Admitting: Physician Assistant

## 2016-01-31 ENCOUNTER — Ambulatory Visit: Payer: Self-pay | Admitting: Physician Assistant

## 2016-01-31 VITALS — BP 132/76 | HR 76 | Temp 99.1°F

## 2016-01-31 DIAGNOSIS — J069 Acute upper respiratory infection, unspecified: Secondary | ICD-10-CM

## 2016-01-31 MED ORDER — PSEUDOEPH-BROMPHEN-DM 30-2-10 MG/5ML PO SYRP: 5.0000 mL | ORAL_SOLUTION | Freq: Four times a day (QID) | ORAL | 0 refills | Status: AC | PRN

## 2016-01-31 NOTE — Progress Notes (Signed)
   Subjective:cough    Patient ID: Charles Phillips., male    DOB: 05-22-1955, 60 y.o.   MRN: [id number]  HPI Patient c/o productive cough for 2 days. States sputum is clear. Also states sinus congestion and frontal headache. No palliative measure for compliant.   Review of Systems Negative except for compliant.    Objective:   Physical Exam Bilateral maxillary guarding, edematous nasal turbinates, with post nasal drainage.  Neck supple, Lungs CTA, and Heart RRR.       Assessment & Plan:URI  Bromfed DM as directed. Follow up with Family Doctor.

## 2016-02-12 ENCOUNTER — Ambulatory Visit: Payer: Self-pay | Admitting: Physician Assistant

## 2016-02-12 ENCOUNTER — Encounter: Payer: Self-pay | Admitting: Physician Assistant

## 2016-02-12 VITALS — BP 110/80 | HR 86 | Temp 98.3°F

## 2016-02-12 DIAGNOSIS — J209 Acute bronchitis, unspecified: Secondary | ICD-10-CM

## 2016-02-12 MED ORDER — LEVOFLOXACIN 500 MG PO TABS: 500.0000 mg | ORAL_TABLET | Freq: Every day | ORAL | 0 refills | Status: AC

## 2016-02-12 MED ORDER — HYDROCOD POLST-CPM POLST ER 10-8 MG/5ML PO SUER: 5.0000 mL | Freq: Two times a day (BID) | ORAL | 0 refills | Status: AC | PRN

## 2016-02-12 NOTE — Progress Notes (Signed)
S: C/o cough and congestion with yellow to green mucus, no fever, chills, states cough is dry and hacking; keeping pt awake at night;  denies cardiac type chest pain or sob, v/d, abd pain, was seen here on dec 29 and given bromfed, now he is worse; Remainder ros neg  O: vitals wnl, nad, tms clear, throat injected, neck supple no lymph, lungs with coarse bs b/l, cv rrr, neuro intact  A:  Acute bronchitis   P:  rx medication: levaquin 500mg  qd, 10d, tussionex 133ml nr; use otc meds, tylenol or motrin as needed for fever/chills, return if not better in 3 -5 days, return earlier if worsening

## 2016-02-28 ENCOUNTER — Other Ambulatory Visit: Payer: Self-pay | Admitting: Physician Assistant

## 2016-02-28 MED ORDER — BENZONATATE 200 MG PO CAPS: 200.0000 mg | ORAL_CAPSULE | Freq: Three times a day (TID) | ORAL | 0 refills | Status: AC | PRN

## 2016-02-28 NOTE — Progress Notes (Unsigned)
Pt called and said he needs something for a cough, was given tussionex on 1/10 and bromphed on 12/29; will call in tessalon perls, if cough continues then pt will need a cxr

## 2016-03-02 DIAGNOSIS — D225 Melanocytic nevi of trunk: Secondary | ICD-10-CM | POA: Diagnosis not present

## 2016-03-02 DIAGNOSIS — D2272 Melanocytic nevi of left lower limb, including hip: Secondary | ICD-10-CM | POA: Diagnosis not present

## 2016-03-02 DIAGNOSIS — L538 Other specified erythematous conditions: Secondary | ICD-10-CM | POA: Diagnosis not present

## 2016-03-02 DIAGNOSIS — L820 Inflamed seborrheic keratosis: Secondary | ICD-10-CM | POA: Diagnosis not present

## 2016-03-02 DIAGNOSIS — D2271 Melanocytic nevi of right lower limb, including hip: Secondary | ICD-10-CM | POA: Diagnosis not present

## 2016-03-02 DIAGNOSIS — D2261 Melanocytic nevi of right upper limb, including shoulder: Secondary | ICD-10-CM | POA: Diagnosis not present

## 2016-03-19 DIAGNOSIS — H52223 Regular astigmatism, bilateral: Secondary | ICD-10-CM | POA: Diagnosis not present

## 2016-03-19 DIAGNOSIS — H5203 Hypermetropia, bilateral: Secondary | ICD-10-CM | POA: Diagnosis not present

## 2016-03-19 DIAGNOSIS — H524 Presbyopia: Secondary | ICD-10-CM | POA: Diagnosis not present

## 2016-03-19 DIAGNOSIS — H2513 Age-related nuclear cataract, bilateral: Secondary | ICD-10-CM | POA: Diagnosis not present

## 2016-05-25 DIAGNOSIS — M9905 Segmental and somatic dysfunction of pelvic region: Secondary | ICD-10-CM | POA: Diagnosis not present

## 2016-05-25 DIAGNOSIS — M9903 Segmental and somatic dysfunction of lumbar region: Secondary | ICD-10-CM | POA: Diagnosis not present

## 2016-05-25 DIAGNOSIS — M955 Acquired deformity of pelvis: Secondary | ICD-10-CM | POA: Diagnosis not present

## 2016-05-25 DIAGNOSIS — M5416 Radiculopathy, lumbar region: Secondary | ICD-10-CM | POA: Diagnosis not present

## 2016-06-22 DIAGNOSIS — E784 Other hyperlipidemia: Secondary | ICD-10-CM | POA: Diagnosis not present

## 2016-06-22 DIAGNOSIS — K219 Gastro-esophageal reflux disease without esophagitis: Secondary | ICD-10-CM | POA: Diagnosis not present

## 2016-06-22 DIAGNOSIS — Z0000 Encounter for general adult medical examination without abnormal findings: Secondary | ICD-10-CM | POA: Diagnosis not present

## 2016-06-22 DIAGNOSIS — Z125 Encounter for screening for malignant neoplasm of prostate: Secondary | ICD-10-CM | POA: Diagnosis not present

## 2016-06-22 DIAGNOSIS — D509 Iron deficiency anemia, unspecified: Secondary | ICD-10-CM | POA: Diagnosis not present

## 2016-06-22 DIAGNOSIS — E034 Atrophy of thyroid (acquired): Secondary | ICD-10-CM | POA: Diagnosis not present

## 2016-10-30 DIAGNOSIS — M5416 Radiculopathy, lumbar region: Secondary | ICD-10-CM | POA: Diagnosis not present

## 2016-10-30 DIAGNOSIS — M9903 Segmental and somatic dysfunction of lumbar region: Secondary | ICD-10-CM | POA: Diagnosis not present

## 2016-10-30 DIAGNOSIS — M955 Acquired deformity of pelvis: Secondary | ICD-10-CM | POA: Diagnosis not present

## 2016-10-30 DIAGNOSIS — M9905 Segmental and somatic dysfunction of pelvic region: Secondary | ICD-10-CM | POA: Diagnosis not present

## 2016-12-16 DIAGNOSIS — E7849 Other hyperlipidemia: Secondary | ICD-10-CM | POA: Diagnosis not present

## 2016-12-23 DIAGNOSIS — E7849 Other hyperlipidemia: Secondary | ICD-10-CM | POA: Diagnosis not present

## 2016-12-23 DIAGNOSIS — E034 Atrophy of thyroid (acquired): Secondary | ICD-10-CM | POA: Diagnosis not present

## 2016-12-23 DIAGNOSIS — K219 Gastro-esophageal reflux disease without esophagitis: Secondary | ICD-10-CM | POA: Diagnosis not present

## 2016-12-23 DIAGNOSIS — D509 Iron deficiency anemia, unspecified: Secondary | ICD-10-CM | POA: Diagnosis not present

## 2016-12-29 DIAGNOSIS — M9903 Segmental and somatic dysfunction of lumbar region: Secondary | ICD-10-CM | POA: Diagnosis not present

## 2016-12-29 DIAGNOSIS — M62830 Muscle spasm of back: Secondary | ICD-10-CM | POA: Diagnosis not present

## 2016-12-29 DIAGNOSIS — M9901 Segmental and somatic dysfunction of cervical region: Secondary | ICD-10-CM | POA: Diagnosis not present

## 2016-12-29 DIAGNOSIS — M5416 Radiculopathy, lumbar region: Secondary | ICD-10-CM | POA: Diagnosis not present

## 2016-12-31 DIAGNOSIS — M9901 Segmental and somatic dysfunction of cervical region: Secondary | ICD-10-CM | POA: Diagnosis not present

## 2016-12-31 DIAGNOSIS — M9903 Segmental and somatic dysfunction of lumbar region: Secondary | ICD-10-CM | POA: Diagnosis not present

## 2016-12-31 DIAGNOSIS — M5416 Radiculopathy, lumbar region: Secondary | ICD-10-CM | POA: Diagnosis not present

## 2016-12-31 DIAGNOSIS — M62830 Muscle spasm of back: Secondary | ICD-10-CM | POA: Diagnosis not present

## 2017-01-19 DIAGNOSIS — M5416 Radiculopathy, lumbar region: Secondary | ICD-10-CM | POA: Diagnosis not present

## 2017-01-19 DIAGNOSIS — M9903 Segmental and somatic dysfunction of lumbar region: Secondary | ICD-10-CM | POA: Diagnosis not present

## 2017-01-19 DIAGNOSIS — M62830 Muscle spasm of back: Secondary | ICD-10-CM | POA: Diagnosis not present

## 2017-01-19 DIAGNOSIS — M9901 Segmental and somatic dysfunction of cervical region: Secondary | ICD-10-CM | POA: Diagnosis not present

## 2017-06-01 DIAGNOSIS — M62830 Muscle spasm of back: Secondary | ICD-10-CM | POA: Diagnosis not present

## 2017-06-01 DIAGNOSIS — M9901 Segmental and somatic dysfunction of cervical region: Secondary | ICD-10-CM | POA: Diagnosis not present

## 2017-06-01 DIAGNOSIS — M9903 Segmental and somatic dysfunction of lumbar region: Secondary | ICD-10-CM | POA: Diagnosis not present

## 2017-06-01 DIAGNOSIS — M5416 Radiculopathy, lumbar region: Secondary | ICD-10-CM | POA: Diagnosis not present

## 2017-06-08 DIAGNOSIS — H52223 Regular astigmatism, bilateral: Secondary | ICD-10-CM | POA: Diagnosis not present

## 2017-06-08 DIAGNOSIS — H5203 Hypermetropia, bilateral: Secondary | ICD-10-CM | POA: Diagnosis not present

## 2017-06-08 DIAGNOSIS — H524 Presbyopia: Secondary | ICD-10-CM | POA: Diagnosis not present

## 2017-06-16 DIAGNOSIS — E7849 Other hyperlipidemia: Secondary | ICD-10-CM | POA: Diagnosis not present

## 2017-06-16 DIAGNOSIS — Z125 Encounter for screening for malignant neoplasm of prostate: Secondary | ICD-10-CM | POA: Diagnosis not present

## 2017-06-16 DIAGNOSIS — D509 Iron deficiency anemia, unspecified: Secondary | ICD-10-CM | POA: Diagnosis not present

## 2017-06-23 DIAGNOSIS — Z0000 Encounter for general adult medical examination without abnormal findings: Secondary | ICD-10-CM | POA: Diagnosis not present

## 2017-06-23 DIAGNOSIS — E034 Atrophy of thyroid (acquired): Secondary | ICD-10-CM | POA: Diagnosis not present

## 2017-06-23 DIAGNOSIS — E7849 Other hyperlipidemia: Secondary | ICD-10-CM | POA: Diagnosis not present

## 2017-06-23 DIAGNOSIS — K219 Gastro-esophageal reflux disease without esophagitis: Secondary | ICD-10-CM | POA: Diagnosis not present

## 2017-07-05 DIAGNOSIS — M9903 Segmental and somatic dysfunction of lumbar region: Secondary | ICD-10-CM | POA: Diagnosis not present

## 2017-07-05 DIAGNOSIS — M5416 Radiculopathy, lumbar region: Secondary | ICD-10-CM | POA: Diagnosis not present

## 2017-07-05 DIAGNOSIS — M62830 Muscle spasm of back: Secondary | ICD-10-CM | POA: Diagnosis not present

## 2017-07-05 DIAGNOSIS — M9901 Segmental and somatic dysfunction of cervical region: Secondary | ICD-10-CM | POA: Diagnosis not present

## 2017-07-06 DIAGNOSIS — Z1211 Encounter for screening for malignant neoplasm of colon: Secondary | ICD-10-CM | POA: Diagnosis not present

## 2017-07-06 DIAGNOSIS — D509 Iron deficiency anemia, unspecified: Secondary | ICD-10-CM | POA: Diagnosis not present

## 2017-09-06 DIAGNOSIS — D509 Iron deficiency anemia, unspecified: Secondary | ICD-10-CM | POA: Diagnosis not present

## 2017-12-14 DIAGNOSIS — D509 Iron deficiency anemia, unspecified: Secondary | ICD-10-CM | POA: Diagnosis not present

## 2017-12-14 DIAGNOSIS — E7849 Other hyperlipidemia: Secondary | ICD-10-CM | POA: Diagnosis not present

## 2017-12-20 DIAGNOSIS — M9901 Segmental and somatic dysfunction of cervical region: Secondary | ICD-10-CM | POA: Diagnosis not present

## 2017-12-20 DIAGNOSIS — M9903 Segmental and somatic dysfunction of lumbar region: Secondary | ICD-10-CM | POA: Diagnosis not present

## 2017-12-20 DIAGNOSIS — M62830 Muscle spasm of back: Secondary | ICD-10-CM | POA: Diagnosis not present

## 2017-12-20 DIAGNOSIS — M5416 Radiculopathy, lumbar region: Secondary | ICD-10-CM | POA: Diagnosis not present

## 2017-12-21 DIAGNOSIS — D509 Iron deficiency anemia, unspecified: Secondary | ICD-10-CM | POA: Diagnosis not present

## 2017-12-21 DIAGNOSIS — K219 Gastro-esophageal reflux disease without esophagitis: Secondary | ICD-10-CM | POA: Diagnosis not present

## 2017-12-21 DIAGNOSIS — E034 Atrophy of thyroid (acquired): Secondary | ICD-10-CM | POA: Diagnosis not present

## 2017-12-21 DIAGNOSIS — E7849 Other hyperlipidemia: Secondary | ICD-10-CM | POA: Diagnosis not present

## 2017-12-23 DIAGNOSIS — K219 Gastro-esophageal reflux disease without esophagitis: Secondary | ICD-10-CM | POA: Diagnosis not present

## 2017-12-23 DIAGNOSIS — R079 Chest pain, unspecified: Secondary | ICD-10-CM | POA: Diagnosis not present

## 2018-03-15 ENCOUNTER — Encounter: Payer: Self-pay | Admitting: Urology

## 2018-03-15 ENCOUNTER — Ambulatory Visit (INDEPENDENT_AMBULATORY_CARE_PROVIDER_SITE_OTHER): Payer: 59 | Admitting: Urology

## 2018-03-15 VITALS — BP 126/80 | HR 82 | Ht 69.0 in | Wt 162.0 lb

## 2018-03-15 DIAGNOSIS — N529 Male erectile dysfunction, unspecified: Secondary | ICD-10-CM | POA: Diagnosis not present

## 2018-03-15 MED ORDER — SILDENAFIL CITRATE 20 MG PO TABS: 20.0000 mg | ORAL_TABLET | ORAL | 11 refills | Status: AC | PRN

## 2018-03-15 NOTE — Patient Instructions (Signed)
Take 1-5 tabs of 20mg  Revatio on an empty stomach for erectile dysfunction. You must take this medication at least one hour prior to sexual activity. OK to take aleve or tylenol for headache.     Erectile Dysfunction Erectile dysfunction (ED) is the inability to get or keep an erection in order to have sexual intercourse. Erectile dysfunction may include:  Inability to get an erection.  Lack of enough hardness of the erection to allow penetration.  Loss of the erection before sex is finished. What are the causes? This condition may be caused by:  Certain medicines, such as: ? Pain relievers. ? Antihistamines. ? Antidepressants. ? Blood pressure medicines. ? Water pills (diuretics). ? Ulcer medicines. ? Muscle relaxants. ? Drugs.  Excessive drinking.  Psychological causes, such as: ? Anxiety. ? Depression. ? Sadness. ? Exhaustion. ? Performance fear. ? Stress.  Physical causes, such as: ? Artery problems. This may include diabetes, smoking, liver disease, or atherosclerosis. ? High blood pressure. ? Hormonal problems, such as low testosterone. ? Obesity. ? Nerve problems. This may include back or pelvic injuries, diabetes mellitus, multiple sclerosis, or Parkinson disease. What are the signs or symptoms? Symptoms of this condition include:  Inability to get an erection.  Lack of enough hardness of the erection to allow penetration.  Loss of the erection before sex is finished.  Normal erections at some times, but with frequent unsatisfactory episodes.  Low sexual satisfaction in either partner due to erection problems.  A curved penis occurring with erection. The curve may cause pain or the penis may be too curved to allow for intercourse.  Never having nighttime erections. How is this diagnosed? This condition is often diagnosed by:  Performing a physical exam to find other diseases or specific problems with the penis.  Asking you detailed questions  about the problem.  Performing blood tests to check for diabetes mellitus or to measure hormone levels.  Performing other tests to check for underlying health conditions.  Performing an ultrasound exam to check for scarring.  Performing a test to check blood flow to the penis.  Doing a sleep study at home to measure nighttime erections. How is this treated? This condition may be treated by:  Medicine taken by mouth to help you achieve an erection (oral medicine).  Hormone replacement therapy to replace low testosterone levels.  Medicine that is injected into the penis. Your health care provider may instruct you how to give yourself these injections at home.  Vacuum pump. This is a pump with a ring on it. The pump and ring are placed on the penis and used to create pressure that helps the penis become erect.  Penile implant surgery. In this procedure, you may receive: ? An inflatable implant. This consists of cylinders, a pump, and a reservoir. The cylinders can be inflated with a fluid that helps to create an erection, and they can be deflated after intercourse. ? A semi-rigid implant. This consists of two silicone rubber rods. The rods provide some rigidity. They are also flexible, so the penis can both curve downward in its normal position and become straight for sexual intercourse.  Blood vessel surgery, to improve blood flow to the penis. During this procedure, a blood vessel from a different part of the body is placed into the penis to allow blood to flow around (bypass) damaged or blocked blood vessels.  Lifestyle changes, such as exercising more, losing weight, and quitting smoking. Follow these instructions at home: Medicines   Take  over-the-counter and prescription medicines only as told by your health care provider. Do not increase the dosage without first discussing it with your health care provider.  If you are using self-injections, perform injections as directed by  your health care provider. Make sure to avoid any veins that are on the surface of the penis. After giving an injection, apply pressure to the injection site for 5 minutes. General instructions  Exercise regularly, as directed by your health care provider. Work with your health care provider to lose weight, if needed.  Do not use any products that contain nicotine or tobacco, such as cigarettes and e-cigarettes. If you need help quitting, ask your health care provider.  Before using a vacuum pump, read the instructions that come with the pump and discuss any questions with your health care provider.  Keep all follow-up visits as told by your health care provider. This is important. Contact a health care provider if:  You feel nauseous.  You vomit. Get help right away if:  You are taking oral or injectable medicines and you have an erection that lasts longer than 4 hours. If your health care provider is unavailable, go to the nearest emergency room for evaluation. An erection that lasts much longer than 4 hours can result in permanent damage to your penis.  You have severe pain in your groin or abdomen.  You develop redness or severe swelling of your penis.  You have redness spreading up into your groin or lower abdomen.  You are unable to urinate.  You experience chest pain or a rapid heart beat (palpitations) after taking oral medicines. Summary  Erectile dysfunction (ED) is the inability to get or keep an erection during sexual intercourse. This problem can usually be treated successfully.  This condition is diagnosed based on a physical exam, your symptoms, and tests to determine the cause. Treatment varies depending on the cause, and may include medicines, hormone therapy, surgery, or vacuum pump.  You may need follow-up visits to make sure that you are using your medicines or devices correctly.  Get help right away if you are taking or injecting medicines and you have an  erection that lasts longer than 4 hours. This information is not intended to replace advice given to you by your health care provider. Make sure you discuss any questions you have with your health care provider. Document Released: 01/17/2000 Document Revised: 02/05/2016 Document Reviewed: 02/05/2016 Elsevier Interactive Patient Education  2019 [employer].

## 2018-03-15 NOTE — Progress Notes (Signed)
   03/15/2018 2:42 PM   [place]. May 19, 1955 [id number]  Referring provider: Adin Hector, MD Charles Phillips, [place] [address]  CC: Erectile dysfunction  HPI: I saw Charles Phillips in urology clinic today in consultation for erectile dysfunction from Dr. Caryl Comes.  He is a very healthy 63 year old male that reports difficulty maintaining an erection over the last year.  Severity is moderate.  He has tried 5 mg Cialis previously, however discontinued this medication secondary to headaches.  He denies any weight loss, flank pain, or gross hematuria.  There are no aggravating or alleviating factors.  He denies a family history of prostate cancer.  PSA was 0.84 in May 2019.  He is a non-smoker.   PMH: Past Medical History:  Diagnosis Date  . Thyroid disease     Allergies:  Allergies  Allergen Reactions  . Sulfa Antibiotics Other (See Comments)    Unspecified reaction    Family History: Family History  Problem Relation Age of Onset  . Bladder Cancer Neg Hx   . Kidney cancer Neg Hx   . Prostate cancer Neg Hx     Social History:  reports that he has never smoked. He does not have any smokeless tobacco history on file. No history on file for alcohol and drug.  ROS: Please see flowsheet from today's date for complete review of systems.  Physical Exam: BP 126/80 (BP Location: Left Arm, Patient Position: Sitting)   Pulse 82   Ht 5\' 9"  (1.753 m)   Wt 162 lb (73.5 kg)   BMI 23.92 kg/m    Constitutional:  Alert and oriented, No acute distress. Cardiovascular: No clubbing, cyanosis, or edema. Respiratory: Normal respiratory effort, no increased work of breathing. GI: Abdomen is soft, nontender, nondistended, no abdominal masses GU: No CVA tenderness Lymph: No cervical or inguinal lymphadenopathy. Skin: No rashes, bruises or suspicious lesions. Neurologic: Grossly intact, no focal deficits, moving all 4 extremities. Psychiatric:  Normal mood and affect.  Laboratory Data: None to review  Pertinent Imaging: None to review  Assessment & Plan:   In summary, the patient is a healthy 63 year old male with 1 year history of difficulty maintaining erection.  Cialis gave him significant headaches and he discontinued this medication.  We discussed at length the treatment algorithm for managing erectile dysfunction.  We discussed starting with the PDE 5 inhibitors, and adjusting dose to minimize side effects while still getting optimal results.  Other alternatives would include the vacuum erection device, penile injections, or penile implant.  -Trial of Revatio 20 to 100 mg on demand, we discussed possible side effects at length -Consider trial of Levitra if side effects from Revatio -Follow-up with Zara Council, PA for discussion of penile injections if refractory to all PDE 5 inhibitors   Billey Co, MD  [place] [address], [place], [place] [address] [phone]

## 2018-06-21 DIAGNOSIS — K219 Gastro-esophageal reflux disease without esophagitis: Secondary | ICD-10-CM | POA: Diagnosis not present

## 2018-06-21 DIAGNOSIS — Z125 Encounter for screening for malignant neoplasm of prostate: Secondary | ICD-10-CM | POA: Diagnosis not present

## 2018-06-21 DIAGNOSIS — E7849 Other hyperlipidemia: Secondary | ICD-10-CM | POA: Diagnosis not present

## 2018-06-21 DIAGNOSIS — D509 Iron deficiency anemia, unspecified: Secondary | ICD-10-CM | POA: Diagnosis not present

## 2018-06-28 DIAGNOSIS — Z0000 Encounter for general adult medical examination without abnormal findings: Secondary | ICD-10-CM | POA: Diagnosis not present

## 2018-06-28 DIAGNOSIS — K219 Gastro-esophageal reflux disease without esophagitis: Secondary | ICD-10-CM | POA: Diagnosis not present

## 2018-06-28 DIAGNOSIS — E034 Atrophy of thyroid (acquired): Secondary | ICD-10-CM | POA: Diagnosis not present

## 2018-06-28 DIAGNOSIS — E7849 Other hyperlipidemia: Secondary | ICD-10-CM | POA: Diagnosis not present

## 2018-09-20 DIAGNOSIS — M9903 Segmental and somatic dysfunction of lumbar region: Secondary | ICD-10-CM | POA: Diagnosis not present

## 2018-09-20 DIAGNOSIS — M62830 Muscle spasm of back: Secondary | ICD-10-CM | POA: Diagnosis not present

## 2018-09-20 DIAGNOSIS — M9901 Segmental and somatic dysfunction of cervical region: Secondary | ICD-10-CM | POA: Diagnosis not present

## 2018-09-20 DIAGNOSIS — M5416 Radiculopathy, lumbar region: Secondary | ICD-10-CM | POA: Diagnosis not present

## 2018-09-22 DIAGNOSIS — M5416 Radiculopathy, lumbar region: Secondary | ICD-10-CM | POA: Diagnosis not present

## 2018-09-22 DIAGNOSIS — M62830 Muscle spasm of back: Secondary | ICD-10-CM | POA: Diagnosis not present

## 2018-09-22 DIAGNOSIS — M9903 Segmental and somatic dysfunction of lumbar region: Secondary | ICD-10-CM | POA: Diagnosis not present

## 2018-09-22 DIAGNOSIS — M9901 Segmental and somatic dysfunction of cervical region: Secondary | ICD-10-CM | POA: Diagnosis not present

## 2018-09-27 DIAGNOSIS — M62830 Muscle spasm of back: Secondary | ICD-10-CM | POA: Diagnosis not present

## 2018-09-27 DIAGNOSIS — M9903 Segmental and somatic dysfunction of lumbar region: Secondary | ICD-10-CM | POA: Diagnosis not present

## 2018-09-27 DIAGNOSIS — M9901 Segmental and somatic dysfunction of cervical region: Secondary | ICD-10-CM | POA: Diagnosis not present

## 2018-09-27 DIAGNOSIS — M5416 Radiculopathy, lumbar region: Secondary | ICD-10-CM | POA: Diagnosis not present

## 2018-10-04 DIAGNOSIS — M9903 Segmental and somatic dysfunction of lumbar region: Secondary | ICD-10-CM | POA: Diagnosis not present

## 2018-10-04 DIAGNOSIS — M62830 Muscle spasm of back: Secondary | ICD-10-CM | POA: Diagnosis not present

## 2018-10-04 DIAGNOSIS — M5416 Radiculopathy, lumbar region: Secondary | ICD-10-CM | POA: Diagnosis not present

## 2018-10-04 DIAGNOSIS — M9901 Segmental and somatic dysfunction of cervical region: Secondary | ICD-10-CM | POA: Diagnosis not present

## 2018-10-11 DIAGNOSIS — M9901 Segmental and somatic dysfunction of cervical region: Secondary | ICD-10-CM | POA: Diagnosis not present

## 2018-10-11 DIAGNOSIS — M62830 Muscle spasm of back: Secondary | ICD-10-CM | POA: Diagnosis not present

## 2018-10-11 DIAGNOSIS — M5416 Radiculopathy, lumbar region: Secondary | ICD-10-CM | POA: Diagnosis not present

## 2018-10-11 DIAGNOSIS — M9903 Segmental and somatic dysfunction of lumbar region: Secondary | ICD-10-CM | POA: Diagnosis not present

## 2018-10-18 DIAGNOSIS — M9901 Segmental and somatic dysfunction of cervical region: Secondary | ICD-10-CM | POA: Diagnosis not present

## 2018-10-18 DIAGNOSIS — M62830 Muscle spasm of back: Secondary | ICD-10-CM | POA: Diagnosis not present

## 2018-10-18 DIAGNOSIS — M9903 Segmental and somatic dysfunction of lumbar region: Secondary | ICD-10-CM | POA: Diagnosis not present

## 2018-10-18 DIAGNOSIS — M5416 Radiculopathy, lumbar region: Secondary | ICD-10-CM | POA: Diagnosis not present

## 2018-11-01 DIAGNOSIS — M5416 Radiculopathy, lumbar region: Secondary | ICD-10-CM | POA: Diagnosis not present

## 2018-11-01 DIAGNOSIS — M9901 Segmental and somatic dysfunction of cervical region: Secondary | ICD-10-CM | POA: Diagnosis not present

## 2018-11-01 DIAGNOSIS — M62830 Muscle spasm of back: Secondary | ICD-10-CM | POA: Diagnosis not present

## 2018-11-01 DIAGNOSIS — M9903 Segmental and somatic dysfunction of lumbar region: Secondary | ICD-10-CM | POA: Diagnosis not present

## 2018-11-29 DIAGNOSIS — M5416 Radiculopathy, lumbar region: Secondary | ICD-10-CM | POA: Diagnosis not present

## 2018-11-29 DIAGNOSIS — M62830 Muscle spasm of back: Secondary | ICD-10-CM | POA: Diagnosis not present

## 2018-11-29 DIAGNOSIS — M9903 Segmental and somatic dysfunction of lumbar region: Secondary | ICD-10-CM | POA: Diagnosis not present

## 2018-11-29 DIAGNOSIS — M9901 Segmental and somatic dysfunction of cervical region: Secondary | ICD-10-CM | POA: Diagnosis not present

## 2018-12-21 DIAGNOSIS — H2513 Age-related nuclear cataract, bilateral: Secondary | ICD-10-CM | POA: Diagnosis not present

## 2018-12-21 DIAGNOSIS — Z135 Encounter for screening for eye and ear disorders: Secondary | ICD-10-CM | POA: Diagnosis not present

## 2018-12-21 DIAGNOSIS — H524 Presbyopia: Secondary | ICD-10-CM | POA: Diagnosis not present

## 2018-12-26 DIAGNOSIS — E7849 Other hyperlipidemia: Secondary | ICD-10-CM | POA: Diagnosis not present

## 2018-12-26 DIAGNOSIS — D509 Iron deficiency anemia, unspecified: Secondary | ICD-10-CM | POA: Diagnosis not present

## 2018-12-28 DIAGNOSIS — D509 Iron deficiency anemia, unspecified: Secondary | ICD-10-CM | POA: Diagnosis not present

## 2018-12-28 DIAGNOSIS — E7849 Other hyperlipidemia: Secondary | ICD-10-CM | POA: Diagnosis not present

## 2018-12-28 DIAGNOSIS — E034 Atrophy of thyroid (acquired): Secondary | ICD-10-CM | POA: Diagnosis not present

## 2018-12-28 DIAGNOSIS — K219 Gastro-esophageal reflux disease without esophagitis: Secondary | ICD-10-CM | POA: Diagnosis not present

## 2019-01-03 DIAGNOSIS — M9903 Segmental and somatic dysfunction of lumbar region: Secondary | ICD-10-CM | POA: Diagnosis not present

## 2019-01-03 DIAGNOSIS — M62830 Muscle spasm of back: Secondary | ICD-10-CM | POA: Diagnosis not present

## 2019-01-03 DIAGNOSIS — M5416 Radiculopathy, lumbar region: Secondary | ICD-10-CM | POA: Diagnosis not present

## 2019-01-03 DIAGNOSIS — M9901 Segmental and somatic dysfunction of cervical region: Secondary | ICD-10-CM | POA: Diagnosis not present

## 2019-01-31 DIAGNOSIS — M9903 Segmental and somatic dysfunction of lumbar region: Secondary | ICD-10-CM | POA: Diagnosis not present

## 2019-01-31 DIAGNOSIS — M62830 Muscle spasm of back: Secondary | ICD-10-CM | POA: Diagnosis not present

## 2019-01-31 DIAGNOSIS — M9901 Segmental and somatic dysfunction of cervical region: Secondary | ICD-10-CM | POA: Diagnosis not present

## 2019-01-31 DIAGNOSIS — M5416 Radiculopathy, lumbar region: Secondary | ICD-10-CM | POA: Diagnosis not present

## 2019-02-06 DIAGNOSIS — I071 Rheumatic tricuspid insufficiency: Secondary | ICD-10-CM | POA: Diagnosis not present

## 2019-02-28 DIAGNOSIS — M9903 Segmental and somatic dysfunction of lumbar region: Secondary | ICD-10-CM | POA: Diagnosis not present

## 2019-02-28 DIAGNOSIS — M62830 Muscle spasm of back: Secondary | ICD-10-CM | POA: Diagnosis not present

## 2019-02-28 DIAGNOSIS — M5416 Radiculopathy, lumbar region: Secondary | ICD-10-CM | POA: Diagnosis not present

## 2019-02-28 DIAGNOSIS — M9901 Segmental and somatic dysfunction of cervical region: Secondary | ICD-10-CM | POA: Diagnosis not present

## 2019-03-24 DIAGNOSIS — M9903 Segmental and somatic dysfunction of lumbar region: Secondary | ICD-10-CM | POA: Diagnosis not present

## 2019-03-24 DIAGNOSIS — M62830 Muscle spasm of back: Secondary | ICD-10-CM | POA: Diagnosis not present

## 2019-03-24 DIAGNOSIS — M9901 Segmental and somatic dysfunction of cervical region: Secondary | ICD-10-CM | POA: Diagnosis not present

## 2019-03-24 DIAGNOSIS — M5416 Radiculopathy, lumbar region: Secondary | ICD-10-CM | POA: Diagnosis not present

## 2019-03-25 DIAGNOSIS — M9901 Segmental and somatic dysfunction of cervical region: Secondary | ICD-10-CM | POA: Diagnosis not present

## 2019-03-25 DIAGNOSIS — M9903 Segmental and somatic dysfunction of lumbar region: Secondary | ICD-10-CM | POA: Diagnosis not present

## 2019-03-25 DIAGNOSIS — M5416 Radiculopathy, lumbar region: Secondary | ICD-10-CM | POA: Diagnosis not present

## 2019-03-25 DIAGNOSIS — M62830 Muscle spasm of back: Secondary | ICD-10-CM | POA: Diagnosis not present

## 2019-03-28 DIAGNOSIS — M62830 Muscle spasm of back: Secondary | ICD-10-CM | POA: Diagnosis not present

## 2019-03-28 DIAGNOSIS — M9903 Segmental and somatic dysfunction of lumbar region: Secondary | ICD-10-CM | POA: Diagnosis not present

## 2019-03-28 DIAGNOSIS — M5416 Radiculopathy, lumbar region: Secondary | ICD-10-CM | POA: Diagnosis not present

## 2019-03-28 DIAGNOSIS — M9901 Segmental and somatic dysfunction of cervical region: Secondary | ICD-10-CM | POA: Diagnosis not present

## 2019-03-30 DIAGNOSIS — M62830 Muscle spasm of back: Secondary | ICD-10-CM | POA: Diagnosis not present

## 2019-03-30 DIAGNOSIS — M9901 Segmental and somatic dysfunction of cervical region: Secondary | ICD-10-CM | POA: Diagnosis not present

## 2019-03-30 DIAGNOSIS — M9903 Segmental and somatic dysfunction of lumbar region: Secondary | ICD-10-CM | POA: Diagnosis not present

## 2019-03-30 DIAGNOSIS — M5416 Radiculopathy, lumbar region: Secondary | ICD-10-CM | POA: Diagnosis not present

## 2019-04-25 DIAGNOSIS — M9903 Segmental and somatic dysfunction of lumbar region: Secondary | ICD-10-CM | POA: Diagnosis not present

## 2019-04-25 DIAGNOSIS — M9901 Segmental and somatic dysfunction of cervical region: Secondary | ICD-10-CM | POA: Diagnosis not present

## 2019-04-25 DIAGNOSIS — M62830 Muscle spasm of back: Secondary | ICD-10-CM | POA: Diagnosis not present

## 2019-04-25 DIAGNOSIS — M5416 Radiculopathy, lumbar region: Secondary | ICD-10-CM | POA: Diagnosis not present

## 2019-05-08 DIAGNOSIS — M5416 Radiculopathy, lumbar region: Secondary | ICD-10-CM | POA: Diagnosis not present

## 2019-05-08 DIAGNOSIS — M9903 Segmental and somatic dysfunction of lumbar region: Secondary | ICD-10-CM | POA: Diagnosis not present

## 2019-05-08 DIAGNOSIS — M9901 Segmental and somatic dysfunction of cervical region: Secondary | ICD-10-CM | POA: Diagnosis not present

## 2019-05-08 DIAGNOSIS — M62830 Muscle spasm of back: Secondary | ICD-10-CM | POA: Diagnosis not present

## 2019-06-22 DIAGNOSIS — E7849 Other hyperlipidemia: Secondary | ICD-10-CM | POA: Diagnosis not present

## 2019-06-22 DIAGNOSIS — Z125 Encounter for screening for malignant neoplasm of prostate: Secondary | ICD-10-CM | POA: Diagnosis not present

## 2019-06-22 DIAGNOSIS — K219 Gastro-esophageal reflux disease without esophagitis: Secondary | ICD-10-CM | POA: Diagnosis not present

## 2019-06-22 DIAGNOSIS — D509 Iron deficiency anemia, unspecified: Secondary | ICD-10-CM | POA: Diagnosis not present

## 2019-06-29 ENCOUNTER — Other Ambulatory Visit: Payer: Self-pay | Admitting: Internal Medicine

## 2019-06-29 DIAGNOSIS — Z0000 Encounter for general adult medical examination without abnormal findings: Secondary | ICD-10-CM | POA: Diagnosis not present

## 2019-06-29 DIAGNOSIS — Z136 Encounter for screening for cardiovascular disorders: Secondary | ICD-10-CM

## 2019-06-29 DIAGNOSIS — I071 Rheumatic tricuspid insufficiency: Secondary | ICD-10-CM | POA: Diagnosis not present

## 2019-06-29 DIAGNOSIS — E034 Atrophy of thyroid (acquired): Secondary | ICD-10-CM | POA: Diagnosis not present

## 2019-06-29 DIAGNOSIS — K219 Gastro-esophageal reflux disease without esophagitis: Secondary | ICD-10-CM | POA: Diagnosis not present

## 2019-06-29 DIAGNOSIS — Z87891 Personal history of nicotine dependence: Secondary | ICD-10-CM

## 2019-06-29 DIAGNOSIS — E7849 Other hyperlipidemia: Secondary | ICD-10-CM

## 2019-07-10 DIAGNOSIS — M9903 Segmental and somatic dysfunction of lumbar region: Secondary | ICD-10-CM | POA: Diagnosis not present

## 2019-07-10 DIAGNOSIS — M5416 Radiculopathy, lumbar region: Secondary | ICD-10-CM | POA: Diagnosis not present

## 2019-07-10 DIAGNOSIS — M62830 Muscle spasm of back: Secondary | ICD-10-CM | POA: Diagnosis not present

## 2019-07-10 DIAGNOSIS — M9901 Segmental and somatic dysfunction of cervical region: Secondary | ICD-10-CM | POA: Diagnosis not present

## 2019-07-12 ENCOUNTER — Other Ambulatory Visit: Payer: Self-pay

## 2019-07-12 ENCOUNTER — Ambulatory Visit
Admission: RE | Admit: 2019-07-12 | Discharge: 2019-07-12 | Disposition: A | Discharge status: Home / Self Care | Payer: 59 | Source: Ambulatory Visit | Attending: Internal Medicine | Admitting: Internal Medicine

## 2019-07-12 DIAGNOSIS — E7849 Other hyperlipidemia: Secondary | ICD-10-CM | POA: Diagnosis not present

## 2019-07-12 DIAGNOSIS — Z136 Encounter for screening for cardiovascular disorders: Secondary | ICD-10-CM | POA: Insufficient documentation

## 2019-07-12 DIAGNOSIS — Z87891 Personal history of nicotine dependence: Secondary | ICD-10-CM | POA: Insufficient documentation

## 2019-08-07 DIAGNOSIS — M9901 Segmental and somatic dysfunction of cervical region: Secondary | ICD-10-CM | POA: Diagnosis not present

## 2019-08-07 DIAGNOSIS — M62830 Muscle spasm of back: Secondary | ICD-10-CM | POA: Diagnosis not present

## 2019-08-07 DIAGNOSIS — M5416 Radiculopathy, lumbar region: Secondary | ICD-10-CM | POA: Diagnosis not present

## 2019-08-07 DIAGNOSIS — M9903 Segmental and somatic dysfunction of lumbar region: Secondary | ICD-10-CM | POA: Diagnosis not present

## 2019-08-29 DIAGNOSIS — M9903 Segmental and somatic dysfunction of lumbar region: Secondary | ICD-10-CM | POA: Diagnosis not present

## 2019-08-29 DIAGNOSIS — M62830 Muscle spasm of back: Secondary | ICD-10-CM | POA: Diagnosis not present

## 2019-08-29 DIAGNOSIS — M9901 Segmental and somatic dysfunction of cervical region: Secondary | ICD-10-CM | POA: Diagnosis not present

## 2019-08-29 DIAGNOSIS — M5416 Radiculopathy, lumbar region: Secondary | ICD-10-CM | POA: Diagnosis not present

## 2019-09-26 DIAGNOSIS — M9901 Segmental and somatic dysfunction of cervical region: Secondary | ICD-10-CM | POA: Diagnosis not present

## 2019-09-26 DIAGNOSIS — M5416 Radiculopathy, lumbar region: Secondary | ICD-10-CM | POA: Diagnosis not present

## 2019-09-26 DIAGNOSIS — M9903 Segmental and somatic dysfunction of lumbar region: Secondary | ICD-10-CM | POA: Diagnosis not present

## 2019-09-26 DIAGNOSIS — M62830 Muscle spasm of back: Secondary | ICD-10-CM | POA: Diagnosis not present

## 2019-10-11 DIAGNOSIS — M62830 Muscle spasm of back: Secondary | ICD-10-CM | POA: Diagnosis not present

## 2019-10-11 DIAGNOSIS — M9901 Segmental and somatic dysfunction of cervical region: Secondary | ICD-10-CM | POA: Diagnosis not present

## 2019-10-11 DIAGNOSIS — M5416 Radiculopathy, lumbar region: Secondary | ICD-10-CM | POA: Diagnosis not present

## 2019-10-11 DIAGNOSIS — M9903 Segmental and somatic dysfunction of lumbar region: Secondary | ICD-10-CM | POA: Diagnosis not present

## 2019-10-24 DIAGNOSIS — M9901 Segmental and somatic dysfunction of cervical region: Secondary | ICD-10-CM | POA: Diagnosis not present

## 2019-10-24 DIAGNOSIS — M5416 Radiculopathy, lumbar region: Secondary | ICD-10-CM | POA: Diagnosis not present

## 2019-10-24 DIAGNOSIS — M62830 Muscle spasm of back: Secondary | ICD-10-CM | POA: Diagnosis not present

## 2019-10-24 DIAGNOSIS — M9903 Segmental and somatic dysfunction of lumbar region: Secondary | ICD-10-CM | POA: Diagnosis not present

## 2019-11-21 DIAGNOSIS — M5416 Radiculopathy, lumbar region: Secondary | ICD-10-CM | POA: Diagnosis not present

## 2019-11-21 DIAGNOSIS — M9901 Segmental and somatic dysfunction of cervical region: Secondary | ICD-10-CM | POA: Diagnosis not present

## 2019-11-21 DIAGNOSIS — M62830 Muscle spasm of back: Secondary | ICD-10-CM | POA: Diagnosis not present

## 2019-11-21 DIAGNOSIS — M9903 Segmental and somatic dysfunction of lumbar region: Secondary | ICD-10-CM | POA: Diagnosis not present

## 2019-12-25 DIAGNOSIS — E7849 Other hyperlipidemia: Secondary | ICD-10-CM | POA: Diagnosis not present

## 2019-12-25 DIAGNOSIS — D509 Iron deficiency anemia, unspecified: Secondary | ICD-10-CM | POA: Diagnosis not present

## 2020-01-01 DIAGNOSIS — E7849 Other hyperlipidemia: Secondary | ICD-10-CM | POA: Diagnosis not present

## 2020-01-01 DIAGNOSIS — E034 Atrophy of thyroid (acquired): Secondary | ICD-10-CM | POA: Diagnosis not present

## 2020-01-01 DIAGNOSIS — D509 Iron deficiency anemia, unspecified: Secondary | ICD-10-CM | POA: Diagnosis not present

## 2020-01-01 DIAGNOSIS — K219 Gastro-esophageal reflux disease without esophagitis: Secondary | ICD-10-CM | POA: Diagnosis not present

## 2020-01-04 ENCOUNTER — Other Ambulatory Visit: Payer: Self-pay | Admitting: Internal Medicine

## 2020-01-08 DIAGNOSIS — D2271 Melanocytic nevi of right lower limb, including hip: Secondary | ICD-10-CM | POA: Diagnosis not present

## 2020-01-08 DIAGNOSIS — L570 Actinic keratosis: Secondary | ICD-10-CM | POA: Diagnosis not present

## 2020-01-08 DIAGNOSIS — X32XXXA Exposure to sunlight, initial encounter: Secondary | ICD-10-CM | POA: Diagnosis not present

## 2020-01-08 DIAGNOSIS — L821 Other seborrheic keratosis: Secondary | ICD-10-CM | POA: Diagnosis not present

## 2020-01-08 DIAGNOSIS — D225 Melanocytic nevi of trunk: Secondary | ICD-10-CM | POA: Diagnosis not present

## 2020-01-08 DIAGNOSIS — D2261 Melanocytic nevi of right upper limb, including shoulder: Secondary | ICD-10-CM | POA: Diagnosis not present

## 2020-01-08 DIAGNOSIS — D2272 Melanocytic nevi of left lower limb, including hip: Secondary | ICD-10-CM | POA: Diagnosis not present

## 2020-01-08 DIAGNOSIS — D2262 Melanocytic nevi of left upper limb, including shoulder: Secondary | ICD-10-CM | POA: Diagnosis not present

## 2022-04-16 IMAGING — US US ABDOMINAL AORTA SCREENING AAA
1 series · 14 of 22 positions shown · non-contrast
Comparison: None.

CLINICAL DATA: Male between 65-75 years of age with a smoking
history.

EXAM:
US ABDOMINAL AORTA MEDICARE SCREENING
TECHNIQUE: Ultrasound examination of the abdominal aorta was performed as a
screening evaluation for abdominal aortic aneurysm.

[Series 1: us aorta medicare screening · 14 of 22 slices shown]
[im 1/22]
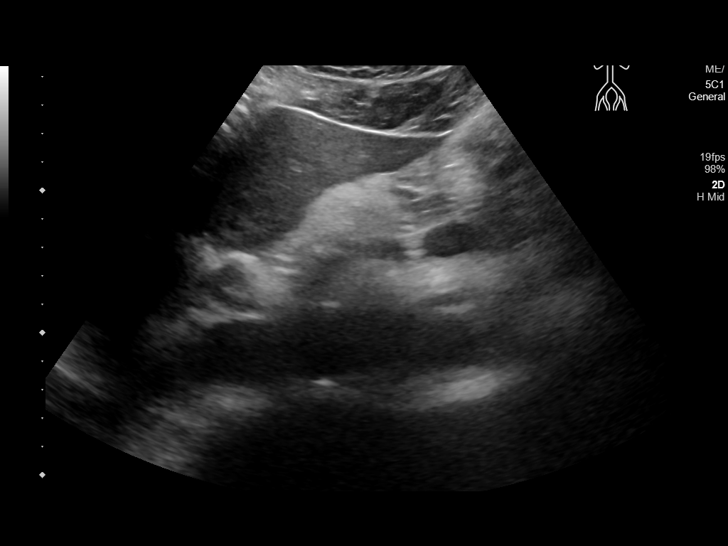
[im 3/22]
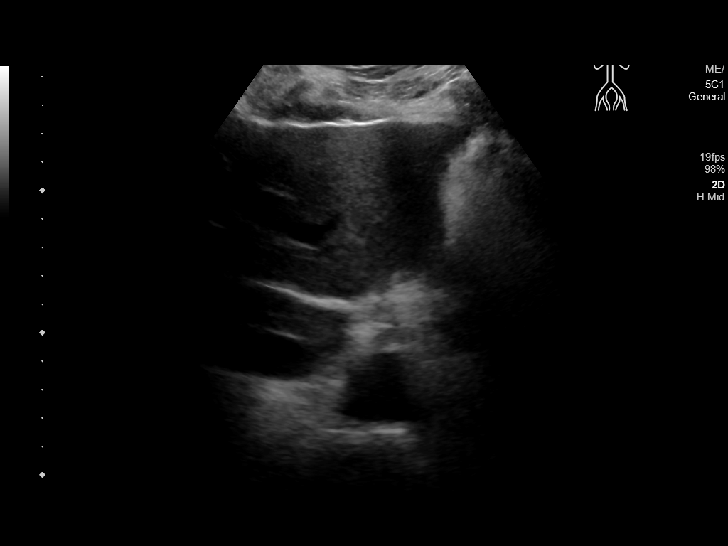
[im 4/22]
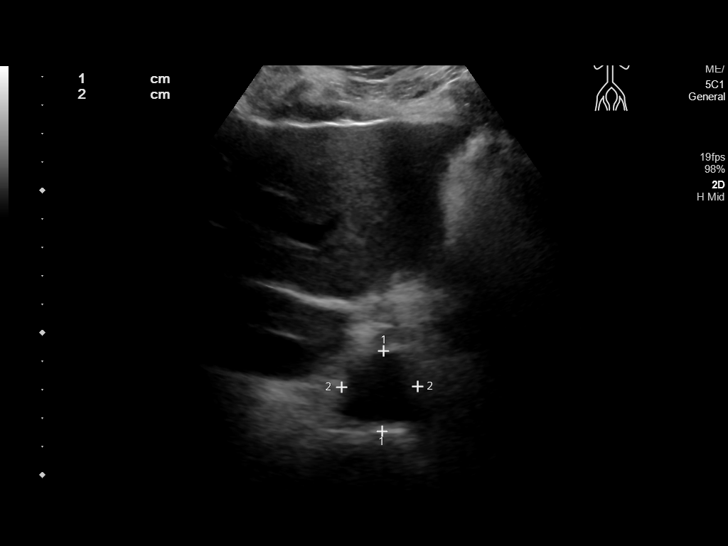
[im 6/22]
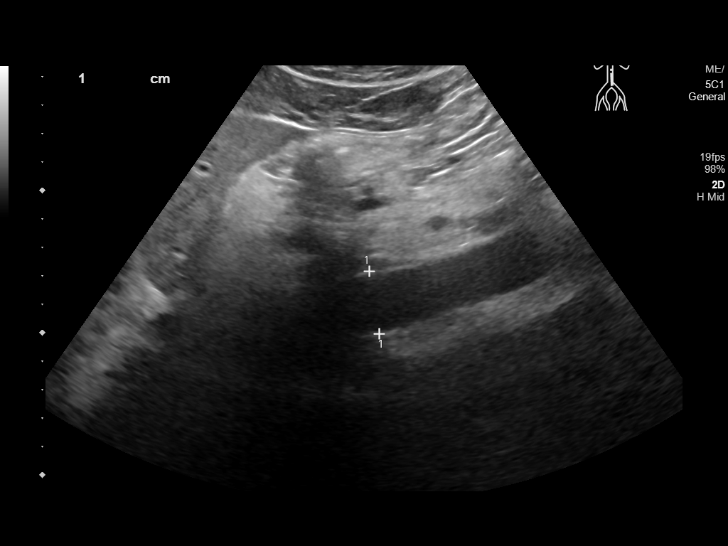
[im 8/22]
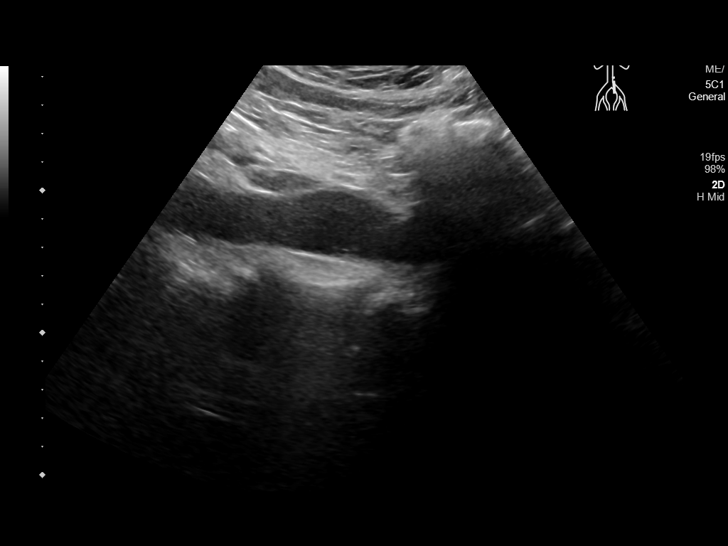
[im 9/22]
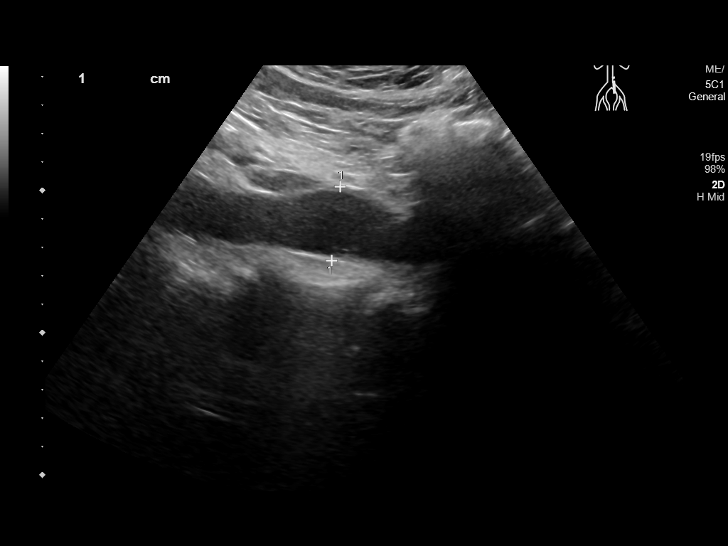
[im 11/22]
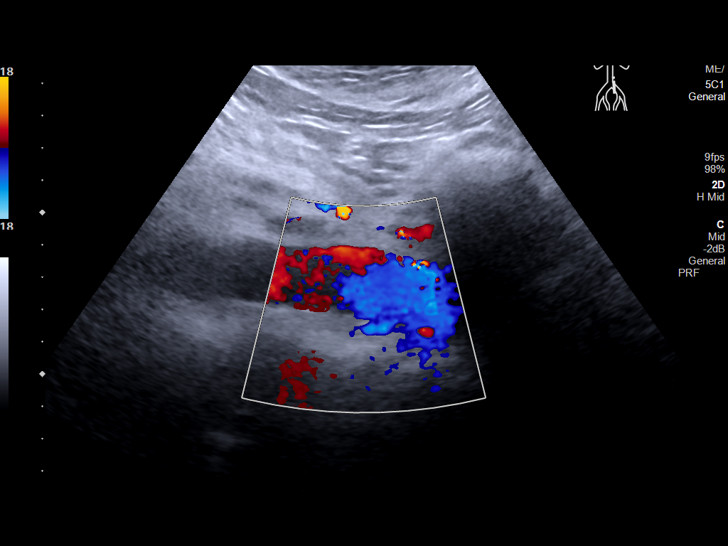
[im 12/22]
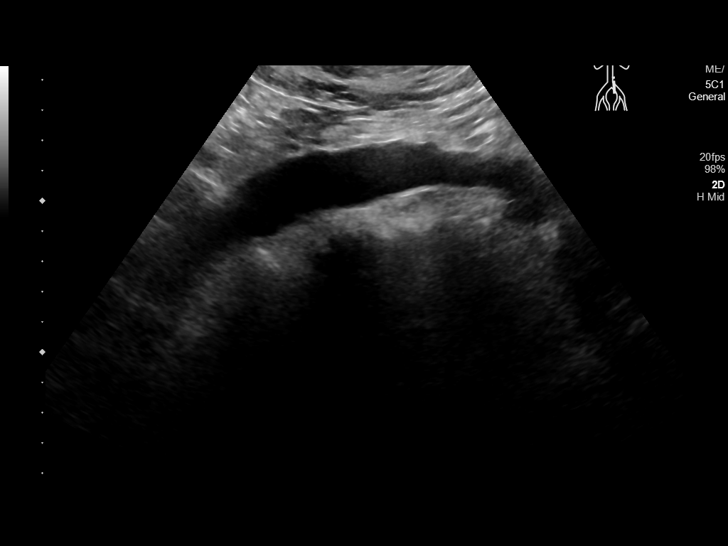
[im 14/22]
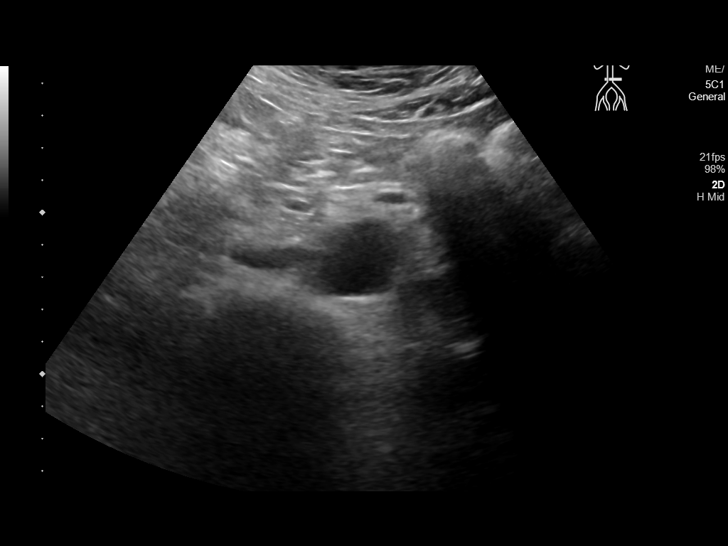
[im 15/22]
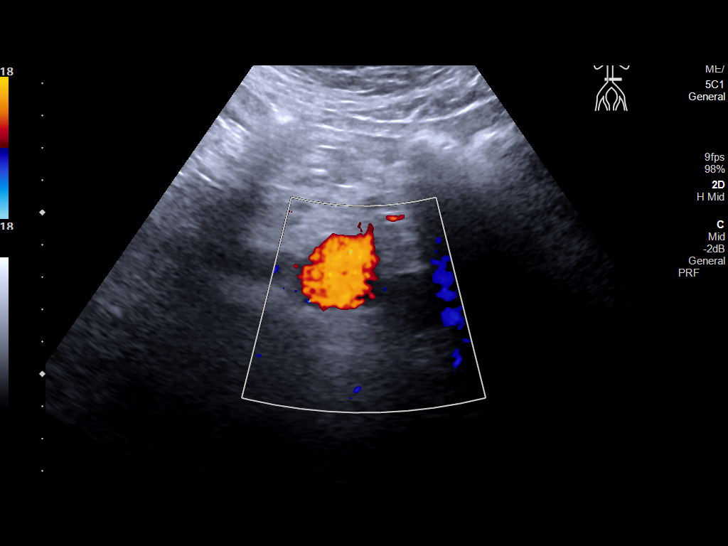
[im 17/22]
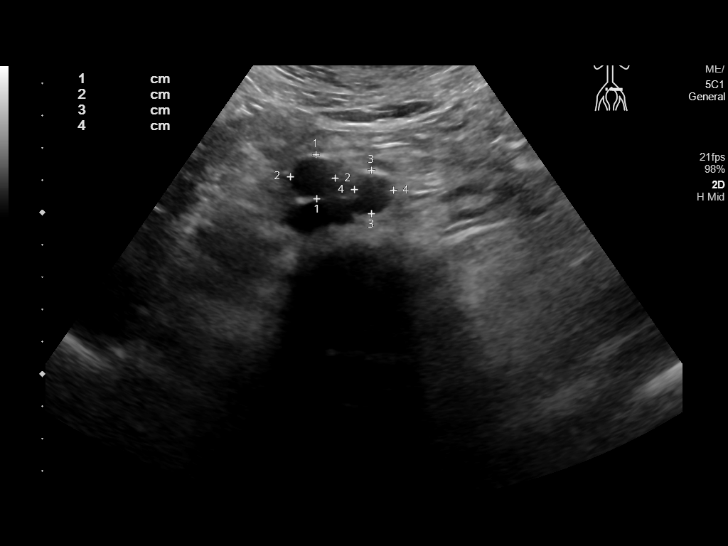
[im 19/22]
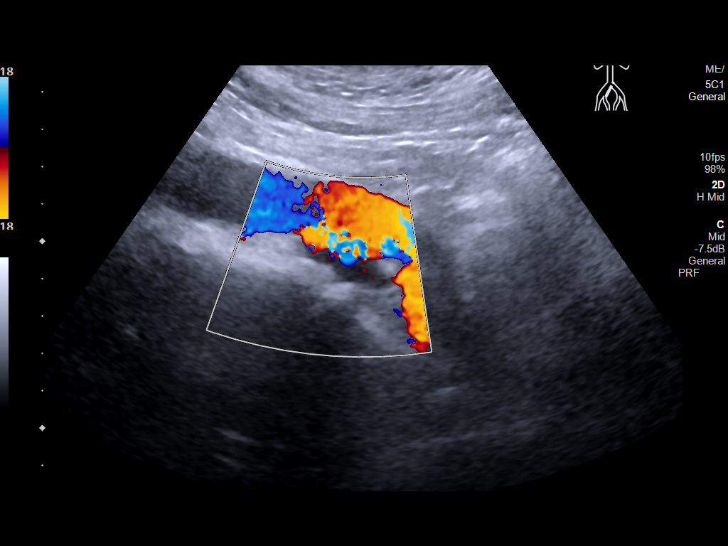
[im 20/22]
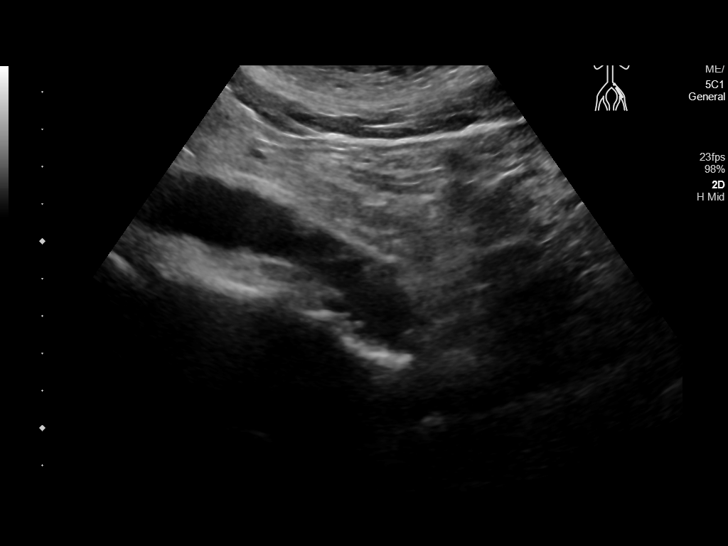
[im 22/22]
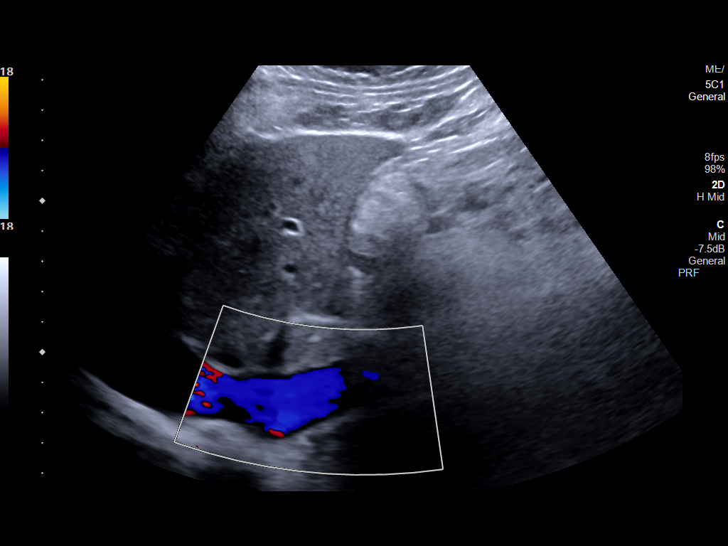

[14 of 22 positions shown; findings below may reference images not displayed]

FINDINGS: Abdominal aortic measurements as follows:

Proximal:  2.8 cm

Mid:  2.0 cm

Distal:  2.3 cm

Minor atherosclerotic change and mild ectasia. Maximal diameter
cm.
IMPRESSION: Abdominal aortic atherosclerosis and mild ectasia. Maximal diameter
2.8 cm.

Ectatic abdominal aorta at risk for aneurysm development. Recommend
followup by ultrasound in 5 years. This recommendation follows ACR
consensus guidelines: White Paper of the ACR Incidental Findings
Committee II on Vascular Findings. [HOSPITAL] 2087;

## 2024-02-11 ENCOUNTER — Encounter: Payer: Self-pay | Admitting: Gastroenterology

## 2024-02-14 ENCOUNTER — Ambulatory Visit: Admitting: Certified Registered Nurse Anesthetist

## 2024-02-14 ENCOUNTER — Ambulatory Visit
Admission: RE | Admit: 2024-02-14 | Discharge: 2024-02-14 | Disposition: A | Attending: Gastroenterology | Admitting: Gastroenterology

## 2024-02-14 ENCOUNTER — Other Ambulatory Visit: Payer: Self-pay

## 2024-02-14 ENCOUNTER — Encounter: Payer: Self-pay | Admitting: Gastroenterology

## 2024-02-14 ENCOUNTER — Encounter: Admission: RE | Disposition: A | Payer: Self-pay | Source: Home / Self Care | Attending: Gastroenterology

## 2024-02-14 DIAGNOSIS — Z1211 Encounter for screening for malignant neoplasm of colon: Secondary | ICD-10-CM | POA: Insufficient documentation

## 2024-02-14 DIAGNOSIS — D122 Benign neoplasm of ascending colon: Secondary | ICD-10-CM | POA: Diagnosis not present

## 2024-02-14 HISTORY — DX: Unspecified osteoarthritis, unspecified site: M19.90

## 2024-02-14 HISTORY — PX: COLONOSCOPY: SHX5424

## 2024-02-14 HISTORY — PX: POLYPECTOMY: SHX149

## 2024-02-14 HISTORY — DX: Gastro-esophageal reflux disease without esophagitis: K21.9

## 2024-02-14 HISTORY — DX: Hypothyroidism, unspecified: E03.9

## 2024-02-14 HISTORY — DX: Iron deficiency anemia, unspecified: D50.9

## 2024-02-14 SURGERY — COLONOSCOPY
Anesthesia: General

## 2024-02-14 MED ORDER — PROPOFOL 10 MG/ML IV BOLUS
INTRAVENOUS | Status: DC | PRN
  Administered 2024-02-14: 100 mg via INTRAVENOUS
  Administered 2024-02-14: 150 ug/kg/min via INTRAVENOUS

## 2024-02-14 MED ORDER — SODIUM CHLORIDE 0.9 % IV SOLN: INTRAVENOUS | Status: DC

## 2024-02-14 MED ORDER — LIDOCAINE HCL (CARDIAC) PF 100 MG/5ML IV SOSY
PREFILLED_SYRINGE | INTRAVENOUS | Status: DC | PRN
  Administered 2024-02-14: 50 mg via INTRAVENOUS

## 2024-02-14 NOTE — H&P (Signed)
 "                                                                                                                           Charles Phillips , MD [address], [place], [place], [address] Phone: [phone] Fax: [phone]  Primary Care Physician:  Fernande Ophelia JINNY DOUGLAS, MD   Pre-Procedure History & Physical: HPI:  Charles Phillips. is a 69 y.o. male is here for an colonoscopy.   Past Medical History:  Diagnosis Date   Arthritis    GERD (gastroesophageal reflux disease)    Hypothyroidism    IDA (iron deficiency anemia)    Thyroid  disease      Prior to Admission medications  Medication Sig Start Date End Date Taking? Authorizing Provider  atorvastatin (LIPITOR) 20 MG tablet Take 20 mg by mouth daily.   Yes [provider]  benzonatate  (TESSALON ) 200 MG capsule Take 1 capsule (200 mg total) by mouth 3 (three) times daily as needed for cough. Patient not taking: Reported on 03/15/2018 02/28/16   Gasper Devere ORN, PA-C  brompheniramine-pseudoephedrine-DM 30-2-10 MG/5ML syrup Take 5 mLs by mouth 4 (four) times daily as needed. Patient not taking: Reported on 03/15/2018 01/31/16   Claudene Tanda POUR, PA-C  chlorpheniramine-HYDROcodone (TUSSIONEX PENNKINETIC ER) 10-8 MG/5ML SUER Take 5 mLs by mouth every 12 (twelve) hours as needed for cough. Patient not taking: Reported on 03/15/2018 02/12/16   Gasper Devere ORN, PA-C  levofloxacin  (LEVAQUIN ) 500 MG tablet Take 1 tablet (500 mg total) by mouth daily. 02/12/16   Gasper Devere ORN, PA-C  levothyroxine (SYNTHROID, LEVOTHROID) 75 MCG tablet  01/29/15   [provider]  Multiple Vitamin (MULTI-VITAMINS) TABS Take by mouth.    [provider]  sildenafil  (REVATIO ) 20 MG tablet Take 1-5 tablets (20-100 mg total) by mouth as needed. 03/15/18   Francisca Redell BROCKS, MD    Allergies as of 01/18/2024 - Review Complete 03/15/2018  Allergen Reaction Noted   Sulfa antibiotics Other (See Comments) 02/26/2015    Family History   Problem Relation Age of Onset   Bladder Cancer Neg Hx    Kidney cancer Neg Hx    Prostate cancer Neg Hx     Social History   Socioeconomic History   Marital status: Married    Spouse name: Not on file   Number of children: Not on file   Years of education: Not on file   Highest education level: Not on file  Occupational History   Not on file  Tobacco Use   Smoking status: Never   Smokeless tobacco: Not on file  Substance and Sexual Activity   Alcohol use: Not on file   Drug use: Not on file   Sexual activity: Not on file  Other Topics Concern   Not on file  Social History Narrative   Not on file   Social Drivers of Health   Tobacco Use: Unknown (02/11/2024)   Patient History  Smoking Tobacco Use: Never    Smokeless Tobacco Use: Unknown    Passive Exposure: Not on file  Financial Resource Strain: Low Risk  (10/12/2023)   Received from [facility]   Overall Financial Resource Strain (CARDIA)    Difficulty of Paying Living Expenses: Not hard at all  Food Insecurity: No Food Insecurity (10/12/2023)   Received from [facility]   Epic    Within the past 12 months, you worried that your food would run out before you got the money to buy more.: Never true    Within the past 12 months, the food you bought just didn't last and you didn't have money to get more.: Never true  Transportation Needs: No Transportation Needs (10/12/2023)   Received from [facility] - Transportation    In the past 12 months, has lack of transportation kept you from medical appointments or from getting medications?: No    Lack of Transportation (Non-Medical): No  Physical Activity: Not on file  Stress: Not on file  Social Connections: Not on file  Intimate Partner Violence: Not on file  Depression (EYV7-0): Not on file  Alcohol Screen: Not on file  Housing: Low Risk  (10/12/2023)   Received from [facility]   Epic     In the last 12 months, was there a time when you were not able to pay the mortgage or rent on time?: No    In the past 12 months, how many times have you moved where you were living?: 0    At any time in the past 12 months, were you homeless or living in a shelter (including now)?: No  Utilities: Not At Risk (10/12/2023)   Received from [facility]   Epic    In the past 12 months has the electric, gas, oil, or water company threatened to shut off services in your home?: No  Health Literacy: Not on file    Review of Systems: See HPI, otherwise negative ROS  Physical Exam: There were no vitals taken for this visit. General:   Alert,  pleasant and cooperative in NAD Head:  Normocephalic and atraumatic. Neck:  Supple; no masses or thyromegaly. Lungs:  Clear throughout to auscultation, normal respiratory effort.    Heart:  +S1, +S2, Regular rate and rhythm, No edema. Abdomen:  Soft, nontender and nondistended. Normal bowel sounds, without guarding, and without rebound.   Neurologic:  Alert and  oriented x4;  grossly normal neurologically.  Impression/Plan: Charles L Goold Jr. is here for an colonoscopy to be performed for Screening colonoscopy average risk   Risks, benefits, limitations, and alternatives regarding  colonoscopy have been reviewed with the patient.  Questions have been answered.  All parties agreeable.   Charles Kung, MD  02/14/2024, 9:48 AM  "

## 2024-02-14 NOTE — Anesthesia Preprocedure Evaluation (Signed)
"                                    Anesthesia Evaluation  Patient identified by MRN, date of birth, ID band Patient awake    Reviewed: Allergy & Precautions, H&P , NPO status , Patient's Chart, lab work & pertinent test results  Airway Mallampati: II  TM Distance: >3 FB Neck ROM: Full    Dental no notable dental hx.    Pulmonary neg pulmonary ROS   Pulmonary exam normal breath sounds clear to auscultation       Cardiovascular negative cardio ROS Normal cardiovascular exam Rhythm:Regular Rate:Normal     Neuro/Psych negative neurological ROS  negative psych ROS   GI/Hepatic negative GI ROS, Neg liver ROS,,,  Endo/Other  negative endocrine ROS    Renal/GU negative Renal ROS  negative genitourinary   Musculoskeletal negative musculoskeletal ROS (+)    Abdominal   Peds negative pediatric ROS (+)  Hematology negative hematology ROS (+)   Anesthesia Other Findings   Reproductive/Obstetrics negative OB ROS                              Anesthesia Physical Anesthesia Plan  ASA: 2  Anesthesia Plan: General   Post-op Pain Management:    Induction: Intravenous  PONV Risk Score and Plan: 0  Airway Management Planned:   Additional Equipment:   Intra-op Plan:   Post-operative Plan: Extubation in OR  Informed Consent: I have reviewed the patients History and Physical, chart, labs and discussed the procedure including the risks, benefits and alternatives for the proposed anesthesia with the patient or authorized representative who has indicated his/her understanding and acceptance.     Dental advisory given  Plan Discussed with: CRNA  Anesthesia Plan Comments:         Anesthesia Quick Evaluation  "

## 2024-02-14 NOTE — Op Note (Signed)
 [facility] Gastroenterology Patient Name: Charles Phillips Procedure Date: 02/14/2024 10:40 AM MRN: [id number] Account #: [id number] Date of Birth: 09-30-1955 Admit Type: Outpatient Age: 69 Room: [facility] ENDO ROOM 1 Gender: Male Note Status: Finalized Instrument Name: Colon Scope [id number] Procedure:             Colonoscopy Indications:           Screening for colorectal malignant neoplasm Providers:             Ruel Kung MD, MD Medicines:             Propofol  per Anesthesia, Monitored Anesthesia Care Complications:         No immediate complications. Procedure:             Pre-Anesthesia Assessment:                        - Prior to the procedure, a History and Physical was                         performed, and patient medications, allergies and                         sensitivities were reviewed. The patient's tolerance                         of previous anesthesia was reviewed.                        - The risks and benefits of the procedure and the                         sedation options and risks were discussed with the                         patient. All questions were answered and informed                         consent was obtained.                        - ASA Grade Assessment: II - A patient with mild                         systemic disease.                        After obtaining informed consent, the colonoscope was                         passed under direct vision. Throughout the procedure,                         the patient's blood pressure, pulse, and oxygen                         saturations were monitored continuously. The                         Colonoscope was introduced through the anus and  advanced to the the cecum, identified by the                         appendiceal orifice. The colonoscopy was performed                         with ease. The patient tolerated the procedure well.                         The  quality of the bowel preparation was excellent. Findings:      The perianal and digital rectal examinations were normal.      Three sessile polyps were found in the ascending colon. The polyps were       4 to 5 mm in size. These polyps were removed with a cold snare.       Resection and retrieval were complete.      The exam was otherwise without abnormality on direct and retroflexion       views. Impression:            - Three 4 to 5 mm polyps in the ascending colon,                         removed with a cold snare. Resected and retrieved.                        - The examination was otherwise normal on direct and                         retroflexion views. Recommendation:        - Discharge patient to home (with escort).                        - Resume previous diet.                        - Continue present medications.                        - Await pathology results.                        - Repeat colonoscopy for surveillance based on                         pathology results. Procedure Code(s):     --- Professional ---                        [id number], Colonoscopy, flexible; with removal of                         tumor(s), polyp(s), or other lesion(s) by snare                         technique Diagnosis Code(s):     --- Professional ---                        Z12.11, Encounter for screening for malignant neoplasm  of colon                        D12.2, Benign neoplasm of ascending colon CPT copyright 2022 [employer]. All rights reserved. The codes documented in this report are preliminary and upon coder review may  be revised to meet current compliance requirements. Ruel Kung, MD Ruel Kung MD, MD 02/14/2024 11:04:45 AM This report has been signed electronically. Number of Addenda: 0 Note Initiated On: 02/14/2024 10:40 AM Scope Withdrawal Time: 0 hours 10 minutes 26 seconds  Total Procedure Duration: 0 hours 12 minutes 33 seconds   Estimated Blood Loss:  Estimated blood loss: none.      [facility] [place]

## 2024-02-14 NOTE — Anesthesia Postprocedure Evaluation (Signed)
"   Anesthesia Post Note  Patient: Charles L Preisler Jr.  Procedure(s) Performed: COLONOSCOPY POLYPECTOMY, INTESTINE  Patient location during evaluation: Endoscopy Anesthesia Type: General Level of consciousness: awake and alert Pain management: pain level controlled Vital Signs Assessment: post-procedure vital signs reviewed and stable Respiratory status: spontaneous breathing, nonlabored ventilation, respiratory function stable and patient connected to nasal cannula oxygen Cardiovascular status: blood pressure returned to baseline and stable Postop Assessment: no apparent nausea or vomiting Anesthetic complications: no   No notable events documented.   Last Vitals:  Vitals:   02/14/24 1107 02/14/24 1126  BP: 98/71   Pulse: 78   Resp: 17   Temp:    SpO2: 99% 99%    Last Pain:  Vitals:   02/14/24 1106  TempSrc: Temporal  PainSc:                  Charles Phillips      "

## 2024-02-14 NOTE — Anesthesia Procedure Notes (Signed)
 Date/Time: 02/14/2024 10:49 AM  Performed by: Dominica Krabbe, CRNAPre-anesthesia Checklist: Patient identified, Emergency Drugs available, Suction available, Patient being monitored and Timeout performed Patient Re-evaluated:Patient Re-evaluated prior to induction Oxygen Delivery Method: Nasal cannula Preoxygenation: Pre-oxygenation with 100% oxygen Induction Type: IV induction

## 2024-02-14 NOTE — Transfer of Care (Signed)
 Immediate Anesthesia Transfer of Care Note  Patient: Charles L Urbach Jr.  Procedure(s) Performed: COLONOSCOPY POLYPECTOMY, INTESTINE  Patient Location: Endoscopy Unit  Anesthesia Type:General  Level of Consciousness: sedated and drowsy  Airway & Oxygen Therapy: Patient Spontanous Breathing  Post-op Assessment: Report given to RN and Post -op Vital signs reviewed and stable  Post vital signs: Reviewed and stable  Last Vitals:  Vitals Value Taken Time  BP 98/71 02/14/24 11:07  Temp    Pulse 81 02/14/24 11:07  Resp 17 02/14/24 11:07  SpO2 98 % 02/14/24 11:07  Vitals shown include unfiled device data.  Last Pain:  Vitals:   02/14/24 0957  TempSrc: Tympanic  PainSc: 0-No pain         Complications: No notable events documented.

## 2024-02-15 LAB — SURGICAL PATHOLOGY

## 2024-02-16 ENCOUNTER — Ambulatory Visit: Payer: Self-pay | Admitting: Gastroenterology
